# Patient Record
Sex: Male | Born: 1950 | Race: White | Hispanic: No | Marital: Married | Smoking: Never smoker
Health system: Southern US, Community
[De-identification: ages and names within clinical notes are randomized; demographics above are authoritative.]

## PROBLEM LIST (undated history)

## (undated) DIAGNOSIS — Z9889 Other specified postprocedural states: Secondary | ICD-10-CM

## (undated) DIAGNOSIS — I639 Cerebral infarction, unspecified: Secondary | ICD-10-CM

## (undated) DIAGNOSIS — M25569 Pain in unspecified knee: Secondary | ICD-10-CM

## (undated) DIAGNOSIS — B351 Tinea unguium: Secondary | ICD-10-CM

## (undated) DIAGNOSIS — I2510 Atherosclerotic heart disease of native coronary artery without angina pectoris: Secondary | ICD-10-CM

## (undated) DIAGNOSIS — M1990 Unspecified osteoarthritis, unspecified site: Secondary | ICD-10-CM

## (undated) DIAGNOSIS — G8929 Other chronic pain: Secondary | ICD-10-CM

## (undated) DIAGNOSIS — Z8719 Personal history of other diseases of the digestive system: Secondary | ICD-10-CM

## (undated) DIAGNOSIS — I10 Essential (primary) hypertension: Secondary | ICD-10-CM

## (undated) DIAGNOSIS — I240 Acute coronary thrombosis not resulting in myocardial infarction: Secondary | ICD-10-CM

## (undated) DIAGNOSIS — I4510 Unspecified right bundle-branch block: Secondary | ICD-10-CM

## (undated) DIAGNOSIS — G459 Transient cerebral ischemic attack, unspecified: Secondary | ICD-10-CM

## (undated) DIAGNOSIS — N529 Male erectile dysfunction, unspecified: Secondary | ICD-10-CM

## (undated) DIAGNOSIS — R112 Nausea with vomiting, unspecified: Secondary | ICD-10-CM

## (undated) DIAGNOSIS — I4891 Unspecified atrial fibrillation: Secondary | ICD-10-CM

## (undated) DIAGNOSIS — Z87442 Personal history of urinary calculi: Secondary | ICD-10-CM

## (undated) DIAGNOSIS — E785 Hyperlipidemia, unspecified: Secondary | ICD-10-CM

## (undated) HISTORY — DX: Tinea unguium: B35.1

## (undated) HISTORY — DX: Transient cerebral ischemic attack, unspecified: G45.9

## (undated) HISTORY — DX: Other chronic pain: G89.29

## (undated) HISTORY — DX: Unspecified atrial fibrillation: I48.91

## (undated) HISTORY — DX: Personal history of other diseases of the digestive system: Z87.19

## (undated) HISTORY — PX: INNER EAR SURGERY: SHX679

## (undated) HISTORY — DX: Morbid (severe) obesity due to excess calories: E66.01

## (undated) HISTORY — DX: Atherosclerotic heart disease of native coronary artery without angina pectoris: I25.10

## (undated) HISTORY — DX: Unspecified right bundle-branch block: I45.10

## (undated) HISTORY — DX: Other specified postprocedural states: Z98.890

## (undated) HISTORY — DX: Essential (primary) hypertension: I10

## (undated) HISTORY — DX: Male erectile dysfunction, unspecified: N52.9

## (undated) HISTORY — DX: Acute coronary thrombosis not resulting in myocardial infarction: I24.0

## (undated) HISTORY — DX: Pain in unspecified knee: M25.569

## (undated) HISTORY — PX: HERNIA REPAIR: SHX51

## (undated) HISTORY — DX: Hyperlipidemia, unspecified: E78.5

## (undated) HISTORY — DX: Unspecified osteoarthritis, unspecified site: M19.90

## (undated) HISTORY — PX: JOINT REPLACEMENT: SHX530

---

## 1996-03-18 DIAGNOSIS — I639 Cerebral infarction, unspecified: Secondary | ICD-10-CM

## 1996-03-18 HISTORY — DX: Cerebral infarction, unspecified: I63.9

## 1999-05-07 ENCOUNTER — Inpatient Hospital Stay (HOSPITAL_COMMUNITY): Admission: EM | Admit: 1999-05-07 | Discharge: 1999-05-09 | Payer: Self-pay | Admitting: Emergency Medicine

## 2013-01-16 DEATH — deceased

## 2016-07-01 ENCOUNTER — Telehealth: Payer: Self-pay

## 2016-07-01 NOTE — Telephone Encounter (Signed)
SENT NOTES TO SCHEDULING 

## 2016-07-03 NOTE — Telephone Encounter (Signed)
ERROR

## 2016-07-08 ENCOUNTER — Encounter: Payer: Self-pay | Admitting: Interventional Cardiology

## 2016-07-08 NOTE — Progress Notes (Signed)
Cardiology Office Note   Date:  07/09/2016   ID:  PAULETTE Melendez, DOB [birth year], MRN [id number]  PCP:  Feliciana Rossetti, MD    No chief complaint on file. preoperative clearance   Wt Readings from Last 3 Encounters:  07/09/16 274 lb (124.3 kg)       History of Present Illness: Oscar Melendez is a 66 y.o. male who is being seen today for the evaluation of preoperative clearance at the request of Dr. Madelon Lips.  He had a cardiac cath in 2009.  He had a fast heart rate and was diagnosed with AFib.  He felt a tightness which led to a cath showing an RCA CTO.  He has been managed medically.  He has not had cardiac issues since that time.  He is trying to lose weight.    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  He walks up 2 flights of stairs without any chest discomfort.  Overall, he feels well except being limited by his knee pain. Once his knee operation is done, he plans to increase his activity level.      Past Medical History:  Diagnosis Date  . Atherosclerotic heart disease of native coronary artery without angina pectoris   . Atrial fibrillation (HCC)   . Chronic knee pain   . Dermatophytosis of nail   . H/O inguinal hernia repair   . Hyperlipemia   . Hypertension   . Impotence of organic origin   . Incomplete RBBB   . Morbid obesity (HCC)   . Onychomycosis   . Osteoarthrosis   . RCA occlusion (HCC)   . Transient cerebral ischemia     Past Surgical History:  Procedure Laterality Date  . HERNIA REPAIR       Current Outpatient Prescriptions  Medication Sig Dispense Refill  . aspirin EC 81 MG tablet Take 81 mg by mouth daily.    Marland Kitchen b complex vitamins capsule Take 1 capsule by mouth daily.    . Calcium Citrate (CAL-CITRATE PO) Take 1 tablet by mouth daily.    Marland Kitchen lisinopril-hydrochlorothiazide (PRINZIDE,ZESTORETIC) 20-25 MG tablet Take 1 tablet by mouth daily.    . Magnesium 100 MG TABS Take 1 tablet by mouth daily.    . metoprolol (LOPRESSOR) 50 MG tablet Take  50 mg by mouth at bedtime.    . traMADol (ULTRAM) 50 MG tablet Take by mouth every 6 (six) hours as needed for moderate pain.    . Vitamin D, Cholecalciferol, 1000 units CAPS Take 1 capsule by mouth daily.     No current facility-administered medications for this visit.     Allergies:   Dye fdc red [red dye]    Social History:  The patient  reports that he has never smoked. He has never used smokeless tobacco. He reports that he does not drink alcohol or use drugs.   Family History:  The patient's family history includes Cancer in his brother; Heart disease in his mother.    ROS:  Please see the history of present illness.   Otherwise, review of systems are positive for Knee pain.   All other systems are reviewed and negative.    PHYSICAL EXAM: VS:  BP (!) 136/92   Pulse 80   Ht 6' (1.829 m)   Wt 274 lb (124.3 kg)   BMI 37.16 kg/m  , BMI Body mass index is 37.16 kg/m. GEN: Well nourished, well developed, in no acute distress  HEENT: normal  Neck: no JVD,  carotid bruits, or masses Cardiac: RRR; no murmurs, rubs, or gallops,no edema  Respiratory:  clear to auscultation bilaterally, normal work of breathing GI: soft, nontender, nondistended, + BS MS: no deformity or atrophy  Skin: warm and dry, no rash Neuro:  Strength and sensation are intact Psych: euthymic mood, full affect   EKG:   The ekg ordered today demonstrates Normal ECG   Recent Labs: No results found for requested labs within last 8760 hours.   Lipid Panel No results found for: CHOL, TRIG, HDL, CHOLHDL, VLDL, LDLCALC, LDLDIRECT   Other studies Reviewed: Additional studies/ records that were reviewed today with results demonstrating: Notes from primary care physician.   ASSESSMENT AND PLAN:  1. Coronary artery disease: He [facility] although I'm unable to find records of the report in Epic. From the other notes, it appears he did have a chronically occluded RCA. He had  collateral filling. He has not had any recent angina. He denies any heart failure symptoms. 2. Preoperative cardiovascular examination: From a cardiovascular standpoint, he is at moderate risk, 3-5%, of cardiac event in the perioperative period.  His risk comes from nonmodifiable risk factors such as age and prior CAD. No further cardiac testing needed at this time given his lack of symptoms. He understands the risk and is willing to proceed with surgery. 3. Long-term, I think he would benefit from lipid-lowering therapy. I explained the anti-inflammatory effects of statins. This may be beneficial to him even prior to knee surgery. He wants to check with his orthopedist before starting any medicine prior to surgery. Could start atorvastatin 10 mg daily. 4. HTN: COntinue BP meds.   Current medicines are reviewed at length with the patient today.  The patient concerns regarding his medicines were addressed.  The following changes have been made:  No change  Labs/ tests ordered today include:   Orders Placed This Encounter  Procedures  . EKG 12-Lead    Recommend 150 minutes/week of aerobic exercise Low fat, low carb, high fiber diet recommended  Disposition:   FU in After knee surgery   Signed, Lance Muss, MD  07/09/2016 9:11 AM    [facility] [address] [place], [place], [place]  [address] Phone: [phone]; Fax: [phone]

## 2016-07-09 ENCOUNTER — Other Ambulatory Visit: Payer: Self-pay

## 2016-07-09 ENCOUNTER — Ambulatory Visit (INDEPENDENT_AMBULATORY_CARE_PROVIDER_SITE_OTHER): Payer: Medicare HMO | Admitting: Interventional Cardiology

## 2016-07-09 ENCOUNTER — Encounter (INDEPENDENT_AMBULATORY_CARE_PROVIDER_SITE_OTHER): Payer: Self-pay

## 2016-07-09 ENCOUNTER — Encounter: Payer: Self-pay | Admitting: Interventional Cardiology

## 2016-07-09 VITALS — BP 136/92 | HR 80 | Ht 72.0 in | Wt 274.0 lb

## 2016-07-09 DIAGNOSIS — Z01810 Encounter for preprocedural cardiovascular examination: Secondary | ICD-10-CM | POA: Diagnosis not present

## 2016-07-09 DIAGNOSIS — I25119 Atherosclerotic heart disease of native coronary artery with unspecified angina pectoris: Secondary | ICD-10-CM

## 2016-07-09 DIAGNOSIS — I2510 Atherosclerotic heart disease of native coronary artery without angina pectoris: Secondary | ICD-10-CM | POA: Insufficient documentation

## 2016-07-09 DIAGNOSIS — I10 Essential (primary) hypertension: Secondary | ICD-10-CM

## 2016-07-09 HISTORY — DX: Atherosclerotic heart disease of native coronary artery without angina pectoris: I25.10

## 2016-07-09 NOTE — Patient Instructions (Signed)
Medication Instructions:  Your physician recommends that you continue on your current medications as directed. Please refer to the Current Medication list given to you today.   Labwork: None ordered.  Testing/Procedures: None ordered.  Follow-Up: Follow up after surgery.  Any Other Special Instructions Will Be Listed Below (If Applicable).     If you need a refill on your cardiac medications before your next appointment, please call your pharmacy.

## 2016-07-10 ENCOUNTER — Telehealth: Payer: Self-pay

## 2016-07-10 NOTE — Telephone Encounter (Signed)
Request for surgical clearance:  1. What type of surgery is being performed? Right Total Knee Replacement   2. When is this surgery scheduled? TBD   3. Are there any medications that need to be held prior to surgery and how long?   4. Name of physician performing surgery? Dr. Madelon Lips   5. What is your office phone and fax number? P- [phone], F- [phone]

## 2016-07-10 NOTE — Telephone Encounter (Signed)
Clearance, OV note, and EKG (no other studies on file) sent to [phone] Attn: Tresa Endo. Confirmation received that fax went through.

## 2016-08-13 ENCOUNTER — Ambulatory Visit: Payer: Self-pay | Admitting: Physician Assistant

## 2016-08-13 NOTE — H&P (Signed)
TOTAL KNEE ADMISSION H&P  Patient is being admitted for right total knee arthroplasty.  Subjective:  Chief Complaint:right knee pain.  HPI: Oscar Melendez, 66 y.o. male, has a history of pain and functional disability in the right knee due to arthritis and has failed non-surgical conservative treatments for greater than 12 weeks to includeNSAID's and/or analgesics, corticosteriod injections, viscosupplementation injections and activity modification.  Onset of symptoms was gradual, starting 10 years ago with progressively worsening course since that time. The patient noted no past surgery on the right knee(s).  Patient currently rates pain in the right knee(s) at 10 out of 10 with activity. Patient has night pain, worsening of pain with activity and weight bearing, pain that interferes with activities of daily living, pain with passive range of motion, crepitus and joint swelling.  Patient has evidence of periarticular osteophytes and joint space narrowing by imaging studies.  There is no active infection.  Patient Active Problem List   Diagnosis Date Noted  . CAD (coronary artery disease) 07/09/2016   Past Medical History:  Diagnosis Date  . Atherosclerotic heart disease of native coronary artery without angina pectoris   . Atrial fibrillation (HCC)   . Chronic knee pain   . Dermatophytosis of nail   . H/O inguinal hernia repair   . Hyperlipemia   . Hypertension   . Impotence of organic origin   . Incomplete RBBB   . Morbid obesity (HCC)   . Onychomycosis   . Osteoarthrosis   . RCA occlusion (HCC)   . Transient cerebral ischemia     Past Surgical History:  Procedure Laterality Date  . HERNIA REPAIR       (Not in a hospital admission) Allergies  Allergen Reactions  . Dye Fdc Red [Red Dye]     BURNING SENSATION    Social History  Substance Use Topics  . Smoking status: Never Smoker  . Smokeless tobacco: Never Used  . Alcohol use No    Family History  Problem Relation  Age of Onset  . Heart disease Mother   . Cancer Brother      Review of Systems  HENT: Positive for hearing loss.   Gastrointestinal: Positive for blood in stool and melena.  Genitourinary: Positive for hematuria.  Musculoskeletal: Positive for joint pain.  All other systems reviewed and are negative.   Objective:  Physical Exam  Constitutional: He is oriented to person, place, and time. He appears well-developed and well-nourished. No distress.  HENT:  Head: Normocephalic and atraumatic.  Nose: Nose normal.  Eyes: Conjunctivae and EOM are normal. Pupils are equal, round, and reactive to light.  Neck: Normal range of motion. Neck supple.  Cardiovascular: Normal rate, regular rhythm, normal heart sounds and intact distal pulses.   Respiratory: Effort normal and breath sounds normal. No respiratory distress. He has no wheezes.  GI: Soft. Bowel sounds are normal. He exhibits no distension. There is no tenderness.  Musculoskeletal:       Right knee: He exhibits swelling. He exhibits normal range of motion. Tenderness found.  Lymphadenopathy:    He has no cervical adenopathy.  Neurological: He is alert and oriented to person, place, and time.  Skin: Skin is warm and dry. No rash noted. No erythema.  Psychiatric: He has a normal mood and affect. His behavior is normal.    Vital signs in last 24 hours: @VSRANGES @  Labs:   Estimated body mass index is 37.16 kg/m as calculated from the following:   Height as  of 07/09/16: 6' (1.829 m).   Weight as of 07/09/16: 124.3 kg (274 lb).   Imaging Review Plain radiographs demonstrate moderate degenerative joint disease of the right knee(s). The overall alignment issignificant varus. The bone quality appears to be good for age and reported activity level.  Assessment/Plan:  End stage arthritis, right knee   The patient history, physical examination, clinical judgment of the provider and imaging studies are consistent with end stage  degenerative joint disease of the right knee(s) and total knee arthroplasty is deemed medically necessary. The treatment options including medical management, injection therapy arthroscopy and arthroplasty were discussed at length. The risks and benefits of total knee arthroplasty were presented and reviewed. The risks due to aseptic loosening, infection, stiffness, patella tracking problems, thromboembolic complications and other imponderables were discussed. The patient acknowledged the explanation, agreed to proceed with the plan and consent was signed. Patient is being admitted for inpatient treatment for surgery, pain control, PT, OT, prophylactic antibiotics, VTE prophylaxis, progressive ambulation and ADL's and discharge planning. The patient is planning to be discharged home with home health services

## 2016-08-19 ENCOUNTER — Encounter (HOSPITAL_COMMUNITY): Payer: Self-pay

## 2016-08-19 ENCOUNTER — Ambulatory Visit (HOSPITAL_COMMUNITY)
Admission: RE | Admit: 2016-08-19 | Discharge: 2016-08-19 | Disposition: A | Discharge status: Home / Self Care | Payer: Medicare HMO | Source: Ambulatory Visit | Attending: Physician Assistant | Admitting: Physician Assistant

## 2016-08-19 ENCOUNTER — Encounter (HOSPITAL_COMMUNITY)
Admission: RE | Admit: 2016-08-19 | Discharge: 2016-08-19 | Disposition: A | Discharge status: Home / Self Care | Payer: Medicare HMO | Source: Ambulatory Visit | Attending: Orthopedic Surgery | Admitting: Orthopedic Surgery

## 2016-08-19 DIAGNOSIS — Z01818 Encounter for other preprocedural examination: Secondary | ICD-10-CM | POA: Insufficient documentation

## 2016-08-19 DIAGNOSIS — I700 Atherosclerosis of aorta: Secondary | ICD-10-CM | POA: Diagnosis not present

## 2016-08-19 DIAGNOSIS — M1711 Unilateral primary osteoarthritis, right knee: Secondary | ICD-10-CM

## 2016-08-19 DIAGNOSIS — Z01812 Encounter for preprocedural laboratory examination: Secondary | ICD-10-CM | POA: Diagnosis not present

## 2016-08-19 HISTORY — DX: Nausea with vomiting, unspecified: R11.2

## 2016-08-19 HISTORY — DX: Nausea with vomiting, unspecified: Z98.890

## 2016-08-19 HISTORY — DX: Personal history of urinary calculi: Z87.442

## 2016-08-19 HISTORY — DX: Cerebral infarction, unspecified: I63.9

## 2016-08-19 LAB — TYPE AND SCREEN
ABO/RH(D): A POS
Antibody Screen: NEGATIVE

## 2016-08-19 LAB — COMPREHENSIVE METABOLIC PANEL
ALK PHOS: 60 U/L (ref 38–126)
ALT: 38 U/L (ref 17–63)
ANION GAP: 9 (ref 5–15)
AST: 32 U/L (ref 15–41)
Albumin: 3.9 g/dL (ref 3.5–5.0)
BILIRUBIN TOTAL: 1.1 mg/dL (ref 0.3–1.2)
BUN: 13 mg/dL (ref 6–20)
CALCIUM: 9.3 mg/dL (ref 8.9–10.3)
CO2: 25 mmol/L (ref 22–32)
Chloride: 105 mmol/L (ref 101–111)
Creatinine, Ser: 1.18 mg/dL (ref 0.61–1.24)
GFR calc Af Amer: >60 mL/min (ref >60)
GFR calc non Af Amer: >60 mL/min (ref >60)
GLUCOSE: 110 mg/dL — AB (ref 65–99)
Potassium: 3.9 mmol/L (ref 3.5–5.1)
Sodium: 139 mmol/L (ref 135–145)
TOTAL PROTEIN: 7.2 g/dL (ref 6.5–8.1)

## 2016-08-19 LAB — ABO/RH: ABO/RH(D): A POS

## 2016-08-19 LAB — CBC WITH DIFFERENTIAL/PLATELET
BASOS PCT: 1 %
Basophils Absolute: 0 10*3/uL (ref 0.0–0.1)
Eosinophils Absolute: 0.2 10*3/uL (ref 0.0–0.7)
Eosinophils Relative: 3 %
HEMATOCRIT: 51.9 % (ref 39.0–52.0)
HEMOGLOBIN: 17.4 g/dL — AB (ref 13.0–17.0)
LYMPHS ABS: 1.5 10*3/uL (ref 0.7–4.0)
LYMPHS PCT: 25 %
MCH: 29.8 pg (ref 26.0–34.0)
MCHC: 33.5 g/dL (ref 30.0–36.0)
MCV: 89 fL (ref 78.0–100.0)
MONOS PCT: 9 %
Monocytes Absolute: 0.6 10*3/uL (ref 0.1–1.0)
NEUTROS ABS: 3.8 10*3/uL (ref 1.7–7.7)
NEUTROS PCT: 62 %
Platelets: 153 10*3/uL (ref 150–400)
RBC: 5.83 MIL/uL — ABNORMAL HIGH (ref 4.22–5.81)
RDW: 14 % (ref 11.5–15.5)
WBC: 6 10*3/uL (ref 4.0–10.5)

## 2016-08-19 LAB — URINALYSIS, ROUTINE W REFLEX MICROSCOPIC
Bilirubin Urine: NEGATIVE
Glucose, UA: NEGATIVE mg/dL
Hgb urine dipstick: NEGATIVE
Ketones, ur: NEGATIVE mg/dL
LEUKOCYTES UA: NEGATIVE
NITRITE: NEGATIVE
Protein, ur: NEGATIVE mg/dL
SPECIFIC GRAVITY, URINE: 1.02 (ref 1.005–1.030)
pH: 5 (ref 5.0–8.0)

## 2016-08-19 LAB — SURGICAL PCR SCREEN
MRSA, PCR: NEGATIVE
STAPHYLOCOCCUS AUREUS: NEGATIVE

## 2016-08-19 LAB — APTT: aPTT: 30 seconds (ref 24–36)

## 2016-08-19 LAB — PROTIME-INR
INR: 1.02
Prothrombin Time: 13.4 seconds (ref 11.4–15.2)

## 2016-08-19 NOTE — Pre-Procedure Instructions (Addendum)
Cecille AverJames D Hove  08/19/2016      [facility] [place], [place] - [address] [place] [id number] Phone: [phone] Fax: [phone]  [facility] [address] - [place], [place] - [id number] [place] [id number] Phone: [phone] Fax: [phone]    Your procedure is scheduled on Fri. June 15  [facility] Admitting at 5:30 A.M.  Call this number if you have problems the morning of surgery:  [phone]   Remember:  Do not eat food or drink liquids after midnight on Thurs. June 14   Take these medicines the morning of surgery with A SIP OF WATER : tylenol if needed, metoprolol,, tramadol if needed              1 week prior to surgery stop: advil, motrin, ibuprofen, aleve, Bc Powders, Goody's, vitamins/herbal medicines.              Stop aspirin per Dr. Madelon Lipsaffrey   Do not wear jewelry.  Do not wear lotions, powders, or perfumes, or deoderant.  Do not shave 48 hours prior to surgery.  Men may shave face and neck.  Do not bring valuables to the hospital.  [facility] is not responsible for any belongings or valuables.  Contacts, dentures or bridgework may not be worn into surgery.  Leave your suitcase in the car.  After surgery it may be brought to your room.  For patients admitted to the hospital, discharge time will be determined by your treatment team.  Patients discharged the day of surgery will not be allowed to drive home.    Special instructions:  [place]- Preparing For Surgery  Before surgery, you can play an important role. Because skin is not sterile, your skin needs to be as free of germs as possible. You can reduce the number of germs on your skin by washing with CHG (chlorahexidine gluconate) Soap before surgery.  CHG is an antiseptic cleaner which kills germs and bonds with the skin to continue killing germs even after washing.  Please do not use if you have an  allergy to CHG or antibacterial soaps. If your skin becomes reddened/irritated stop using the CHG.  Do not shave (including legs and underarms) for at least 48 hours prior to first CHG shower. It is OK to shave your face.  Please follow these instructions carefully.   1. Shower the NIGHT BEFORE SURGERY and the MORNING OF SURGERY with CHG.   2. If you chose to wash your hair, wash your hair first as usual with your normal shampoo.  3. After you shampoo, rinse your hair and body thoroughly to remove the shampoo.  4. Use CHG as you would any other liquid soap. You can apply CHG directly to the skin and wash gently with a scrungie or a clean washcloth.   5. Apply the CHG Soap to your body ONLY FROM THE NECK DOWN.  Do not use on open wounds or open sores. Avoid contact with your eyes, ears, mouth and genitals (private parts). Wash genitals (private parts) with your normal soap.  6. Wash thoroughly, paying special attention to the area where your surgery will be performed.  7. Thoroughly rinse your body with warm water from the neck down.  8. DO NOT shower/wash with your normal soap after using and rinsing off the CHG Soap.  9. Pat yourself dry with a CLEAN TOWEL.  10. Wear CLEAN PAJAMAS   11. Place CLEAN SHEETS on your bed the night of your first shower and DO NOT SLEEP WITH PETS.    Day of Surgery: Do not apply any deodorants/lotions. Please wear clean clothes to the hospital/surgery center.      Please read over the following fact sheets that you were given. Coughing and Deep Breathing, MRSA Information and Surgical Site Infection Prevention

## 2016-08-19 NOTE — Progress Notes (Addendum)
PCP: Dr. Feliciana RossettiGreg Grisso @ [facility] [place] Cardiologist: Dr. Eldridge DaceVaranasi Pt. Stated he is to stop aspirin 1 week prior tos surgery.  Pt. Arrived with blood pressure 148/104. States he didn't take blood pressure meds. This am. Thought he was to fast and not take medications. Pt. States he checks his blood pressure at home and dialostic number is usually 80-81. Encourage pt to continue to take the pressure at home and it dialostic is higher than 100 he should notify Dr. Karenann CaiVarasani. Helyn NumbersAllison Zeleneck, P.A. Notified.

## 2016-08-20 LAB — URINE CULTURE: CULTURE: NO GROWTH

## 2016-08-20 NOTE — Progress Notes (Signed)
Anesthesia Chart Review:  Pt is a 66 year old male scheduled for R total knee arthroplasty on 08/30/2016 with Frederico Hammananiel Caffrey, M.D.  - PCP is Feliciana RossettiGreg Grisso, MD - Cardiologist is Lance MussJayadeep Varanasi, MD who has cleared pt for surgery at moderate risk.   PMH includes: CAD (reportedly CTO of RCA by 2009 cath), atrial fibrillation, HTN hyperlipidemia, stroke, TIA, post-op N/V. Never smoker. BMI 36.  Medications include: ASA 81 mg, lisinopril-HCTZ, metoprolol, potassium  BP (!) 131/102 Comment: rechecked/notified Cathy S. RN  Pulse 78   Temp 36.5 C   Resp 20   Ht 6' (1.829 m)   Wt 266 lb 12.8 oz (121 kg)   SpO2 95%   BMI 36.18 kg/m    BP was initially 148/104, recheck was 131/102. Pt had not taken BP meds the day of PAT. Pt instructed to take meds as prescribed and to check BP at home; if diastolic BP higher than 100, pt to notify Dr. Eldridge DaceVaranasi.  - BP at recent health care appointments 136/92 and 130/90.   Preoperative labs reviewed.  CXR 08/19/16: No active cardiopulmonary disease. Aortic atherosclerosis.   EKG 07/09/16: NSR  If BP acceptable DOS, I anticipate pt can proceed as scheduled.   Rica Mastngela Nazario Russom, FNP-BC [facility] Phone: [phone] 08/20/2016 3:13 PM

## 2016-08-29 MED ORDER — DEXTROSE 5 % IV SOLN
3.0000 g | INTRAVENOUS | Status: AC
  Administered 2016-08-30: 3 g via INTRAVENOUS
  Filled 2016-08-29: qty 3000

## 2016-08-29 MED ORDER — SODIUM CHLORIDE 0.9 % IV SOLN: INTRAVENOUS | Status: DC

## 2016-08-29 MED ORDER — BUPIVACAINE LIPOSOME 1.3 % IJ SUSP
20.0000 mL | INTRAMUSCULAR | Status: AC
  Administered 2016-08-30: 20 mL
  Filled 2016-08-29: qty 20

## 2016-08-29 MED ORDER — TRANEXAMIC ACID 1000 MG/10ML IV SOLN
1000.0000 mg | INTRAVENOUS | Status: DC
  Filled 2016-08-29: qty 10

## 2016-08-29 NOTE — Anesthesia Preprocedure Evaluation (Addendum)
Anesthesia Evaluation  Patient identified by MRN, date of birth, ID band Patient awake    Reviewed: Allergy & Precautions, H&P , NPO status , Patient's Chart, lab work & pertinent test results, reviewed documented beta blocker date and time   History of Anesthesia Complications (+) PONV  Airway Mallampati: II  TM Distance: >3 FB Neck ROM: Full    Dental no notable dental hx. (+) Teeth Intact, Dental Advisory Given   Pulmonary neg pulmonary ROS,    Pulmonary exam normal breath sounds clear to auscultation       Cardiovascular Exercise Tolerance: Good hypertension, Pt. on medications and Pt. on home beta blockers + CAD   Rhythm:Regular Rate:Normal     Neuro/Psych CVA, No Residual Symptoms negative psych ROS   GI/Hepatic negative GI ROS, Neg liver ROS,   Endo/Other  Morbid obesity  Renal/GU negative Renal ROS  negative genitourinary   Musculoskeletal  (+) Arthritis , Osteoarthritis,    Abdominal   Peds  Hematology negative hematology ROS (+)   Anesthesia Other Findings   Reproductive/Obstetrics negative OB ROS                            Anesthesia Physical Anesthesia Plan  ASA: III  Anesthesia Plan: Spinal   Post-op Pain Management:  Regional for Post-op pain   Induction: Intravenous  PONV Risk Score and Plan: 3 and Ondansetron, Dexamethasone, Propofol and Midazolam  Airway Management Planned: Simple Face Mask  Additional Equipment:   Intra-op Plan:   Post-operative Plan:   Informed Consent: I have reviewed the patients History and Physical, chart, labs and discussed the procedure including the risks, benefits and alternatives for the proposed anesthesia with the patient or authorized representative who has indicated his/her understanding and acceptance.   Dental advisory given  Plan Discussed with: CRNA  Anesthesia Plan Comments:        Anesthesia Quick  Evaluation

## 2016-08-30 ENCOUNTER — Inpatient Hospital Stay (HOSPITAL_COMMUNITY): Payer: Medicare HMO | Admitting: Anesthesiology

## 2016-08-30 ENCOUNTER — Inpatient Hospital Stay (HOSPITAL_COMMUNITY): Payer: Medicare HMO | Admitting: Vascular Surgery

## 2016-08-30 ENCOUNTER — Inpatient Hospital Stay (HOSPITAL_COMMUNITY)
Admission: RE | Admit: 2016-08-30 | Discharge: 2016-09-01 | DRG: 470 | Disposition: A | Discharge status: Home Health | Payer: Medicare HMO | Source: Ambulatory Visit | Attending: Orthopedic Surgery | Admitting: Orthopedic Surgery

## 2016-08-30 ENCOUNTER — Encounter (HOSPITAL_COMMUNITY): Payer: Self-pay | Admitting: Urology

## 2016-08-30 ENCOUNTER — Encounter (HOSPITAL_COMMUNITY)
Admission: RE | Disposition: A | Discharge status: Home Health | Payer: Self-pay | Source: Ambulatory Visit | Attending: Orthopedic Surgery

## 2016-08-30 DIAGNOSIS — M25561 Pain in right knee: Secondary | ICD-10-CM | POA: Diagnosis present

## 2016-08-30 DIAGNOSIS — I4891 Unspecified atrial fibrillation: Secondary | ICD-10-CM | POA: Diagnosis present

## 2016-08-30 DIAGNOSIS — I2510 Atherosclerotic heart disease of native coronary artery without angina pectoris: Secondary | ICD-10-CM | POA: Diagnosis present

## 2016-08-30 DIAGNOSIS — D62 Acute posthemorrhagic anemia: Secondary | ICD-10-CM | POA: Diagnosis not present

## 2016-08-30 DIAGNOSIS — Z6837 Body mass index (BMI) 37.0-37.9, adult: Secondary | ICD-10-CM

## 2016-08-30 DIAGNOSIS — E785 Hyperlipidemia, unspecified: Secondary | ICD-10-CM | POA: Diagnosis present

## 2016-08-30 DIAGNOSIS — Z8673 Personal history of transient ischemic attack (TIA), and cerebral infarction without residual deficits: Secondary | ICD-10-CM

## 2016-08-30 DIAGNOSIS — I10 Essential (primary) hypertension: Secondary | ICD-10-CM | POA: Diagnosis present

## 2016-08-30 DIAGNOSIS — M1711 Unilateral primary osteoarthritis, right knee: Secondary | ICD-10-CM | POA: Diagnosis present

## 2016-08-30 DIAGNOSIS — Z8249 Family history of ischemic heart disease and other diseases of the circulatory system: Secondary | ICD-10-CM

## 2016-08-30 HISTORY — PX: TOTAL KNEE ARTHROPLASTY: SHX125

## 2016-08-30 HISTORY — DX: Atherosclerotic heart disease of native coronary artery without angina pectoris: I25.10

## 2016-08-30 SURGERY — ARTHROPLASTY, KNEE, TOTAL
Anesthesia: Spinal | Site: Knee | Laterality: Right

## 2016-08-30 MED ORDER — TRAMADOL HCL 50 MG PO TABS
100.0000 mg | ORAL_TABLET | Freq: Four times a day (QID) | ORAL | Status: DC | PRN
  Administered 2016-08-30 – 2016-08-31 (×2): 100 mg via ORAL
  Filled 2016-08-30 (×2): qty 2

## 2016-08-30 MED ORDER — APIXABAN 2.5 MG PO TABS: 2.5000 mg | ORAL_TABLET | Freq: Two times a day (BID) | ORAL | 0 refills | Status: DC

## 2016-08-30 MED ORDER — CEFAZOLIN SODIUM-DEXTROSE 1-4 GM/50ML-% IV SOLN
1.0000 g | Freq: Four times a day (QID) | INTRAVENOUS | Status: AC
  Administered 2016-08-30 (×2): 1 g via INTRAVENOUS
  Filled 2016-08-30 (×2): qty 50

## 2016-08-30 MED ORDER — ASPIRIN EC 81 MG PO TBEC
81.0000 mg | DELAYED_RELEASE_TABLET | Freq: Every day | ORAL | Status: DC
  Administered 2016-08-30 – 2016-09-01 (×3): 81 mg via ORAL
  Filled 2016-08-30 (×2): qty 1

## 2016-08-30 MED ORDER — GABAPENTIN 300 MG PO CAPS
ORAL_CAPSULE | ORAL | Status: AC
  Administered 2016-08-30: 600 mg via ORAL
  Filled 2016-08-30: qty 2

## 2016-08-30 MED ORDER — HYDROCODONE-ACETAMINOPHEN 5-325 MG PO TABS: 1.0000 | ORAL_TABLET | ORAL | 0 refills | Status: AC | PRN

## 2016-08-30 MED ORDER — LIDOCAINE 2% (20 MG/ML) 5 ML SYRINGE
INTRAMUSCULAR | Status: AC
  Filled 2016-08-30: qty 5

## 2016-08-30 MED ORDER — SODIUM CHLORIDE 0.9 % IR SOLN
Status: DC | PRN
  Administered 2016-08-30: 1000 mL

## 2016-08-30 MED ORDER — ROPIVACAINE HCL 5 MG/ML IJ SOLN
INTRAMUSCULAR | Status: DC | PRN
  Administered 2016-08-30: 25 mL via PERINEURAL

## 2016-08-30 MED ORDER — MIDAZOLAM HCL 2 MG/2ML IJ SOLN
INTRAMUSCULAR | Status: AC
  Filled 2016-08-30: qty 2

## 2016-08-30 MED ORDER — SODIUM CHLORIDE 0.9 % IV SOLN
INTRAVENOUS | Status: DC
  Administered 2016-08-30 – 2016-08-31 (×2): via INTRAVENOUS

## 2016-08-30 MED ORDER — MIDAZOLAM HCL 2 MG/2ML IJ SOLN
INTRAMUSCULAR | Status: DC | PRN
  Administered 2016-08-30: 2 mg via INTRAVENOUS

## 2016-08-30 MED ORDER — BUPIVACAINE-EPINEPHRINE (PF) 0.25% -1:200000 IJ SOLN
INTRAMUSCULAR | Status: AC
  Filled 2016-08-30: qty 30

## 2016-08-30 MED ORDER — ONDANSETRON HCL 4 MG/2ML IJ SOLN
INTRAMUSCULAR | Status: AC
  Filled 2016-08-30: qty 2

## 2016-08-30 MED ORDER — LIDOCAINE 2% (20 MG/ML) 5 ML SYRINGE
INTRAMUSCULAR | Status: DC | PRN
  Administered 2016-08-30: 100 mg via INTRAVENOUS

## 2016-08-30 MED ORDER — BUPIVACAINE-EPINEPHRINE (PF) 0.25% -1:200000 IJ SOLN
INTRAMUSCULAR | Status: DC | PRN
  Administered 2016-08-30: 50 mL

## 2016-08-30 MED ORDER — METOCLOPRAMIDE HCL 5 MG PO TABS: 5.0000 mg | ORAL_TABLET | Freq: Three times a day (TID) | ORAL | Status: DC | PRN

## 2016-08-30 MED ORDER — CHLORHEXIDINE GLUCONATE 4 % EX LIQD: 60.0000 mL | Freq: Once | CUTANEOUS | Status: DC

## 2016-08-30 MED ORDER — FLEET ENEMA 7-19 GM/118ML RE ENEM: 1.0000 | ENEMA | Freq: Once | RECTAL | Status: DC | PRN

## 2016-08-30 MED ORDER — DOCUSATE SODIUM 100 MG PO CAPS
100.0000 mg | ORAL_CAPSULE | Freq: Two times a day (BID) | ORAL | Status: DC
  Administered 2016-08-30 – 2016-09-01 (×4): 100 mg via ORAL
  Filled 2016-08-30 (×4): qty 1

## 2016-08-30 MED ORDER — VITAMIN D 1000 UNITS PO TABS
1000.0000 [IU] | ORAL_TABLET | Freq: Every day | ORAL | Status: DC
  Administered 2016-08-30 – 2016-09-01 (×3): 1000 [IU] via ORAL
  Filled 2016-08-30 (×3): qty 1

## 2016-08-30 MED ORDER — ACETAMINOPHEN 325 MG PO TABS: 650.0000 mg | ORAL_TABLET | Freq: Four times a day (QID) | ORAL | Status: DC | PRN

## 2016-08-30 MED ORDER — HYDROMORPHONE HCL 1 MG/ML IJ SOLN
1.0000 mg | INTRAMUSCULAR | Status: DC | PRN
  Administered 2016-08-30 – 2016-09-01 (×6): 1 mg via INTRAVENOUS
  Filled 2016-08-30 (×6): qty 1

## 2016-08-30 MED ORDER — APIXABAN 2.5 MG PO TABS
2.5000 mg | ORAL_TABLET | Freq: Two times a day (BID) | ORAL | Status: DC
  Administered 2016-08-31 – 2016-09-01 (×3): 2.5 mg via ORAL
  Filled 2016-08-30 (×3): qty 1

## 2016-08-30 MED ORDER — TRAMADOL HCL 50 MG PO TABS: 100.0000 mg | ORAL_TABLET | Freq: Four times a day (QID) | ORAL | 0 refills | Status: DC | PRN

## 2016-08-30 MED ORDER — PHENYLEPHRINE 40 MCG/ML (10ML) SYRINGE FOR IV PUSH (FOR BLOOD PRESSURE SUPPORT)
PREFILLED_SYRINGE | INTRAVENOUS | Status: AC
  Filled 2016-08-30: qty 10

## 2016-08-30 MED ORDER — HYDROMORPHONE HCL 1 MG/ML IJ SOLN: 0.2500 mg | INTRAMUSCULAR | Status: DC | PRN

## 2016-08-30 MED ORDER — LISINOPRIL 20 MG PO TABS
20.0000 mg | ORAL_TABLET | Freq: Every day | ORAL | Status: DC
Start: 2016-08-30 — End: 2016-09-01
  Administered 2016-08-30 – 2016-09-01 (×3): 20 mg via ORAL
  Filled 2016-08-30 (×3): qty 1

## 2016-08-30 MED ORDER — GABAPENTIN 300 MG PO CAPS
600.0000 mg | ORAL_CAPSULE | Freq: Once | ORAL | Status: AC
  Administered 2016-08-30: 600 mg via ORAL
  Filled 2016-08-30: qty 2

## 2016-08-30 MED ORDER — MAGNESIUM OXIDE 400 (241.3 MG) MG PO TABS
200.0000 mg | ORAL_TABLET | Freq: Every day | ORAL | Status: DC
  Administered 2016-08-31 – 2016-09-01 (×2): 200 mg via ORAL
  Filled 2016-08-30 (×2): qty 1

## 2016-08-30 MED ORDER — PHENOL 1.4 % MT LIQD: 1.0000 | OROMUCOSAL | Status: DC | PRN

## 2016-08-30 MED ORDER — BUPIVACAINE IN DEXTROSE 0.75-8.25 % IT SOLN
INTRATHECAL | Status: DC | PRN
  Administered 2016-08-30: 15 mg via INTRATHECAL

## 2016-08-30 MED ORDER — POTASSIUM CHLORIDE 20 MEQ/15ML (10%) PO SOLN
1.3000 meq | Freq: Every day | ORAL | Status: DC
  Administered 2016-08-31 – 2016-09-01 (×2): 1.3333 meq via ORAL
  Filled 2016-08-30 (×2): qty 15

## 2016-08-30 MED ORDER — PHENYLEPHRINE HCL 10 MG/ML IJ SOLN
INTRAMUSCULAR | Status: DC | PRN
  Administered 2016-08-30: 25 ug/min via INTRAVENOUS

## 2016-08-30 MED ORDER — METOPROLOL TARTRATE 25 MG PO TABS
25.0000 mg | ORAL_TABLET | Freq: Every day | ORAL | Status: DC
  Administered 2016-08-31 – 2016-09-01 (×2): 25 mg via ORAL
  Filled 2016-08-30 (×2): qty 1

## 2016-08-30 MED ORDER — ACETAMINOPHEN 500 MG PO TABS
ORAL_TABLET | ORAL | Status: AC
  Administered 2016-08-30: 1000 mg via ORAL
  Filled 2016-08-30: qty 2

## 2016-08-30 MED ORDER — HYDROCHLOROTHIAZIDE 25 MG PO TABS
25.0000 mg | ORAL_TABLET | Freq: Every day | ORAL | Status: DC
  Administered 2016-08-30 – 2016-09-01 (×3): 25 mg via ORAL
  Filled 2016-08-30 (×3): qty 1

## 2016-08-30 MED ORDER — POLYETHYLENE GLYCOL 3350 17 G PO PACK: 17.0000 g | PACK | Freq: Every day | ORAL | Status: DC | PRN

## 2016-08-30 MED ORDER — LACTATED RINGERS IV SOLN
INTRAVENOUS | Status: DC | PRN
  Administered 2016-08-30 (×2): via INTRAVENOUS

## 2016-08-30 MED ORDER — MENTHOL 3 MG MT LOZG: 1.0000 | LOZENGE | OROMUCOSAL | Status: DC | PRN

## 2016-08-30 MED ORDER — SORBITOL 70 % SOLN: 30.0000 mL | Freq: Every day | Status: DC | PRN

## 2016-08-30 MED ORDER — SODIUM CHLORIDE 0.9 % IV SOLN
2000.0000 mg | INTRAVENOUS | Status: AC
  Administered 2016-08-30: 2000 mg via TOPICAL
  Filled 2016-08-30: qty 20

## 2016-08-30 MED ORDER — SODIUM CHLORIDE 0.9% FLUSH
INTRAVENOUS | Status: DC | PRN
  Administered 2016-08-30: 50 mL via INTRAVENOUS

## 2016-08-30 MED ORDER — DIPHENHYDRAMINE HCL 12.5 MG/5ML PO ELIX: 12.5000 mg | ORAL_SOLUTION | ORAL | Status: DC | PRN

## 2016-08-30 MED ORDER — ONDANSETRON HCL 4 MG/2ML IJ SOLN
4.0000 mg | Freq: Four times a day (QID) | INTRAMUSCULAR | Status: DC | PRN
  Administered 2016-08-30: 4 mg via INTRAVENOUS
  Filled 2016-08-30: qty 2

## 2016-08-30 MED ORDER — ONDANSETRON HCL 4 MG PO TABS: 4.0000 mg | ORAL_TABLET | Freq: Four times a day (QID) | ORAL | Status: DC | PRN

## 2016-08-30 MED ORDER — METOCLOPRAMIDE HCL 5 MG/ML IJ SOLN
5.0000 mg | Freq: Three times a day (TID) | INTRAMUSCULAR | Status: DC | PRN
Start: 2016-08-30 — End: 2016-09-01

## 2016-08-30 MED ORDER — FENTANYL CITRATE (PF) 250 MCG/5ML IJ SOLN
INTRAMUSCULAR | Status: DC | PRN
  Administered 2016-08-30: 50 ug via INTRAVENOUS

## 2016-08-30 MED ORDER — ACETAMINOPHEN 500 MG PO TABS
1000.0000 mg | ORAL_TABLET | Freq: Once | ORAL | Status: AC
  Administered 2016-08-30: 1000 mg via ORAL
  Filled 2016-08-30: qty 2

## 2016-08-30 MED ORDER — PROPOFOL 10 MG/ML IV BOLUS
INTRAVENOUS | Status: AC
  Filled 2016-08-30: qty 20

## 2016-08-30 MED ORDER — FENTANYL CITRATE (PF) 250 MCG/5ML IJ SOLN
INTRAMUSCULAR | Status: AC
  Filled 2016-08-30: qty 5

## 2016-08-30 MED ORDER — PROPOFOL 500 MG/50ML IV EMUL
INTRAVENOUS | Status: DC | PRN
  Administered 2016-08-30: 50 ug/kg/min via INTRAVENOUS

## 2016-08-30 MED ORDER — HYDROCODONE-ACETAMINOPHEN 5-325 MG PO TABS
1.0000 | ORAL_TABLET | ORAL | Status: DC | PRN
  Administered 2016-09-01: 2 via ORAL
  Filled 2016-08-30: qty 2

## 2016-08-30 MED ORDER — LISINOPRIL-HYDROCHLOROTHIAZIDE 20-25 MG PO TABS: 1.0000 | ORAL_TABLET | Freq: Every day | ORAL | Status: DC

## 2016-08-30 SURGICAL SUPPLY — 66 items
BANDAGE ACE 4X5 VEL STRL LF (GAUZE/BANDAGES/DRESSINGS) ×3 IMPLANT
BANDAGE ACE 6X5 VEL STRL LF (GAUZE/BANDAGES/DRESSINGS) ×3 IMPLANT
BANDAGE ELASTIC 4 VELCRO ST LF (GAUZE/BANDAGES/DRESSINGS) ×3 IMPLANT
BANDAGE ELASTIC 6 VELCRO ST LF (GAUZE/BANDAGES/DRESSINGS) ×3 IMPLANT
BANDAGE ESMARK 6X9 LF (GAUZE/BANDAGES/DRESSINGS) ×1 IMPLANT
BLADE SAGITTAL 25.0X1.19X90 (BLADE) ×2 IMPLANT
BLADE SAGITTAL 25.0X1.19X90MM (BLADE) ×1
BLADE SAW SAG 90X13X1.27 (BLADE) ×3 IMPLANT
BNDG ESMARK 6X9 LF (GAUZE/BANDAGES/DRESSINGS) ×3
BNDG GAUZE ELAST 4 BULKY (GAUZE/BANDAGES/DRESSINGS) ×3 IMPLANT
BOWL SMART MIX CTS (DISPOSABLE) ×3 IMPLANT
CAP KNEE TOTAL 3 SIGMA ×3 IMPLANT
COVER SURGICAL LIGHT HANDLE (MISCELLANEOUS) ×3 IMPLANT
CUFF TOURNIQUET SINGLE 34IN LL (TOURNIQUET CUFF) ×3 IMPLANT
CUFF TOURNIQUET SINGLE 44IN (TOURNIQUET CUFF) IMPLANT
DRAPE INCISE IOBAN 66X45 STRL (DRAPES) IMPLANT
DRAPE ORTHO SPLIT 77X108 STRL (DRAPES) ×4
DRAPE SURG ORHT 6 SPLT 77X108 (DRAPES) ×2 IMPLANT
DRAPE U-SHAPE 47X51 STRL (DRAPES) ×3 IMPLANT
DRSG ADAPTIC 3X8 NADH LF (GAUZE/BANDAGES/DRESSINGS) ×3 IMPLANT
DRSG PAD ABDOMINAL 8X10 ST (GAUZE/BANDAGES/DRESSINGS) ×6 IMPLANT
DURAPREP 26ML APPLICATOR (WOUND CARE) ×3 IMPLANT
ELECT REM PT RETURN 9FT ADLT (ELECTROSURGICAL) ×3
ELECTRODE REM PT RTRN 9FT ADLT (ELECTROSURGICAL) ×1 IMPLANT
EVACUATOR 1/8 PVC DRAIN (DRAIN) IMPLANT
FACESHIELD WRAPAROUND (MASK) ×6 IMPLANT
FLOSEAL 10ML (HEMOSTASIS) IMPLANT
GAUZE SPONGE 4X4 12PLY STRL (GAUZE/BANDAGES/DRESSINGS) ×3 IMPLANT
GAUZE SPONGE 4X4 12PLY STRL LF (GAUZE/BANDAGES/DRESSINGS) ×3 IMPLANT
GLOVE BIOGEL PI IND STRL 8 (GLOVE) ×4 IMPLANT
GLOVE BIOGEL PI INDICATOR 8 (GLOVE) ×8
GLOVE ORTHO TXT STRL SZ7.5 (GLOVE) ×3 IMPLANT
GLOVE SURG ORTHO 8.0 STRL STRW (GLOVE) ×3 IMPLANT
GOWN STRL REUS W/ TWL LRG LVL3 (GOWN DISPOSABLE) ×2 IMPLANT
GOWN STRL REUS W/ TWL XL LVL3 (GOWN DISPOSABLE) ×1 IMPLANT
GOWN STRL REUS W/TWL 2XL LVL3 (GOWN DISPOSABLE) ×3 IMPLANT
GOWN STRL REUS W/TWL LRG LVL3 (GOWN DISPOSABLE) ×4
GOWN STRL REUS W/TWL XL LVL3 (GOWN DISPOSABLE) ×2
HANDPIECE INTERPULSE COAX TIP (DISPOSABLE) ×2
HOOD PEEL AWAY FACE SHEILD DIS (HOOD) ×3 IMPLANT
IMMOBILIZER KNEE 22 UNIV (SOFTGOODS) IMPLANT
KIT BASIN OR (CUSTOM PROCEDURE TRAY) ×3 IMPLANT
KIT ROOM TURNOVER OR (KITS) ×3 IMPLANT
MANIFOLD NEPTUNE II (INSTRUMENTS) ×3 IMPLANT
NEEDLE 22X1 1/2 (OR ONLY) (NEEDLE) ×6 IMPLANT
NS IRRIG 1000ML POUR BTL (IV SOLUTION) ×3 IMPLANT
PACK TOTAL JOINT (CUSTOM PROCEDURE TRAY) ×3 IMPLANT
PAD ARMBOARD 7.5X6 YLW CONV (MISCELLANEOUS) ×6 IMPLANT
PAD CAST 4YDX4 CTTN HI CHSV (CAST SUPPLIES) ×1 IMPLANT
PADDING CAST COTTON 4X4 STRL (CAST SUPPLIES) ×2
PADDING CAST COTTON 6X4 STRL (CAST SUPPLIES) ×3 IMPLANT
SET HNDPC FAN SPRY TIP SCT (DISPOSABLE) ×1 IMPLANT
STAPLER VISISTAT 35W (STAPLE) ×3 IMPLANT
SUCTION FRAZIER HANDLE 10FR (MISCELLANEOUS) ×2
SUCTION TUBE FRAZIER 10FR DISP (MISCELLANEOUS) ×1 IMPLANT
SUT ETHIBOND NAB CT1 #1 30IN (SUTURE) ×9 IMPLANT
SUT VIC AB 0 CT1 27 (SUTURE) ×2
SUT VIC AB 0 CT1 27XBRD ANBCTR (SUTURE) ×1 IMPLANT
SUT VIC AB 2-0 CT1 27 (SUTURE) ×4
SUT VIC AB 2-0 CT1 TAPERPNT 27 (SUTURE) ×2 IMPLANT
SYR CONTROL 10ML LL (SYRINGE) ×6 IMPLANT
TOWEL OR 17X24 6PK STRL BLUE (TOWEL DISPOSABLE) ×3 IMPLANT
TOWEL OR 17X26 10 PK STRL BLUE (TOWEL DISPOSABLE) ×3 IMPLANT
TRAY CATH 16FR W/PLASTIC CATH (SET/KITS/TRAYS/PACK) IMPLANT
TRAY FOLEY W/METER SILVER 16FR (SET/KITS/TRAYS/PACK) IMPLANT
WATER STERILE IRR 1000ML POUR (IV SOLUTION) ×3 IMPLANT

## 2016-08-30 NOTE — Interval H&P Note (Signed)
History and Physical Interval Note:  08/30/2016 7:25 AM  Oscar Melendez  has presented today for surgery, with the diagnosis of OA RIGHT KNEE  The various methods of treatment have been discussed with the patient and family. After consideration of risks, benefits and other options for treatment, the patient has consented to  Procedure(s): RIGHT TOTAL KNEE ARTHROPLASTY (Right) as a surgical intervention .  The patient's history has been reviewed, patient examined, no change in status, stable for surgery.  I have reviewed the patient's chart and labs.  Questions were answered to the patient's satisfaction.     Imraan Wendell JR,W D

## 2016-08-30 NOTE — Brief Op Note (Signed)
08/30/2016  9:37 AM  PATIENT:  Oscar Melendez  66 y.o. male  PRE-OPERATIVE DIAGNOSIS:  OA RIGHT KNEE  POST-OPERATIVE DIAGNOSIS:  OA Right Knee  PROCEDURE:  Procedure(s): RIGHT TOTAL KNEE ARTHROPLASTY (Right)  SURGEON:  Surgeon(s) and Role:    Frederico Hamman* Caffrey, Daniel, MD - Primary  PHYSICIAN ASSISTANT: Margart SicklesJoshua Shacola Schussler, PA-C  ASSISTANTS:    ANESTHESIA:   local, regional and spinal  EBL:  Total I/O In: 1000 [I.V.:1000] Out: -   BLOOD ADMINISTERED:none  DRAINS: none   LOCAL MEDICATIONS USED:  MARCAINE     SPECIMEN:  No Specimen  DISPOSITION OF SPECIMEN:  N/A  COUNTS:  YES  TOURNIQUET:   Total Tourniquet Time Documented: Thigh (Right) - 61 minutes Total: Thigh (Right) - 61 minutes   DICTATION: .Other Dictation: Dictation Number unknown  PLAN OF CARE: Admit to inpatient   PATIENT DISPOSITION:  PACU - hemodynamically stable.   Delay start of Pharmacological VTE agent (>24hrs) due to surgical blood loss or risk of bleeding: yes

## 2016-08-30 NOTE — Progress Notes (Signed)
Orthopedic Tech Progress Note Patient Details:  Oscar Melendez 1950-03-24 [id number]  CPM Right Knee CPM Right Knee: On Right Knee Flexion (Degrees): 90 Right Knee Extension (Degrees): 0 Additional Comments: trapeze bar patient helper   Nikki DomCrawford, Ebony Yorio 08/30/2016, 10:26 AM Viewed order from doctor's order list

## 2016-08-30 NOTE — Op Note (Signed)
NAME:  Oscar Melendez, Oscar Melendez                    ACCOUNT NO.:  MEDICAL RECORD NO.:  [id number]  LOCATION:                                 FACILITY:  PHYSICIAN:  [name], M.D.         DATE OF BIRTH:  DATE OF PROCEDURE:  08/30/2016 DATE OF DISCHARGE:                              OPERATIVE REPORT   PREOPERATIVE DIAGNOSIS:  Osteoarthritis, right knee with flexion contracture and varus deformity.  POSTOPERATIVE DIAGNOSIS:  Osteoarthritis, right knee with flexion contracture and varus deformity.  PROCEDURES PERFORMED:  Right total knee replacement.  Sigma cemented knee, size 5 femur, tibia with 41 mm all poly patella with 10 mm bearing.  SURGEON:  [name], M.D.  ASSISTANVincent Peyer:  [name], PA.  ANESTHESIA:  Spinal.  TOURNIQUET TIME:  60 minutes.  DESCRIPTION OF PROCEDURE:  Sterile prep and drape, exsanguination of leg, inflation to 350 mmHg.  Straight skin incision was made with medial parapatellar approach to the knee.  We cut an 11 mm 5 degree valgus cut on the femur.  Thereafter, cutting about 4 mm below the most diseased compartment of  the medial compartment with the extension gap being measured at 10 mm.  We sized the femur to be a size 5, placed in the all- in-1 cutting block in the appropriate degree of external rotation with anterior and posterior chamfer cuts.  The PCL was released and posterior osteophytes removed from the knee.  The flexion gap equaled the extension gap at 10 mm.  [name] was cut for the tibia, box cut for the femur.  Trial femur and tibia were placed followed by cutting all-poly trial on the patella leaving about 17-18 mm of native patella.  All components were placed.  Full extension was noted.  Resolution of the flexion contracture, varus deformity was noted as well.  Excellent stability, good balancing of the ligaments, no tendency for bearing dislocation or spin out.  The final components were prepared on the back table.  Cement was inserted in the  doughy state.  After pulsatile lavage,  we infiltrated the capsule and the subcutaneous tissues with Exparel and Marcaine mixture.  Used topical TXA.  Components were inserted in tibia followed by femur patella.  Elected to use a trial bearing while the cement hardened.  Cement was allowed to harden then the trial bearing was removed.  No excess cement was noted in the posterior aspect of the knee.  The tourniquet was released.  No excessive bleeding was noted. Final bearing was placed.  Closure was affected with #1 Ethibond, 2-0 Vicryl, and skin clips.  Taken to recovery room in stable condition.     [name], M.D.     WDC/MEDQ  D:  08/30/2016  T:  08/30/2016  Job:  [id number]

## 2016-08-30 NOTE — Anesthesia Procedure Notes (Signed)
Spinal  Patient location during procedure: OR Start time: 08/30/2016 7:30 AM End time: 08/30/2016 7:35 AM Staffing Anesthesiologist: Gaynelle AduFITZGERALD, Zayah Keilman Performed: anesthesiologist  Preanesthetic Checklist Completed: patient identified, surgical consent, pre-op evaluation, timeout performed, IV checked, risks and benefits discussed and monitors and equipment checked Spinal Block Patient position: sitting Prep: DuraPrep Patient monitoring: cardiac monitor, continuous pulse ox and blood pressure Approach: midline Location: L3-4 Injection technique: single-shot Needle Needle type: Pencan  Needle gauge: 24 G Needle length: 9 cm Assessment Sensory level: T8 Additional Notes Functioning IV was confirmed and monitors were applied. Sterile prep and drape, including hand hygiene and sterile gloves were used. The patient was positioned and the spine was prepped. The skin was anesthetized with lidocaine.  Free flow of clear CSF was obtained prior to injecting local anesthetic into the CSF.  The spinal needle aspirated freely following injection.  The needle was carefully withdrawn.  The patient tolerated the procedure well.

## 2016-08-30 NOTE — Interval H&P Note (Signed)
History and Physical Interval Note:  08/30/2016 7:30 AM  Oscar Melendez  has presented today for surgery, with the diagnosis of OA RIGHT KNEE  The various methods of treatment have been discussed with the patient and family. After consideration of risks, benefits and other options for treatment, the patient has consented to  Procedure(s): RIGHT TOTAL KNEE ARTHROPLASTY (Right) as a surgical intervention .  The patient's history has been reviewed, patient examined, no change in status, stable for surgery.  I have reviewed the patient's chart and labs.  Questions were answered to the patient's satisfaction.     Iren Whipp JR,W D

## 2016-08-30 NOTE — Anesthesia Procedure Notes (Signed)
Anesthesia Regional Block: Adductor canal block   Pre-Anesthetic Checklist: ,, timeout performed, Correct Patient, Correct Site, Correct Laterality, Correct Procedure, Correct Position, site marked, Risks and benefits discussed, pre-op evaluation,  At surgeon's request and post-op pain management  Laterality: Right  Prep: Maximum Sterile Barrier Precautions used, chloraprep       Needles:  Injection technique: Single-shot  Needle Type: Echogenic Stimulator Needle     Needle Length: 9cm  Needle Gauge: 21     Additional Needles:   Procedures: ultrasound guided,,,,,,,,  Narrative:  Start time: 08/30/2016 6:56 AM End time: 08/30/2016 7:06 AM Injection made incrementally with aspirations every 5 mL. Anesthesiologist: Gaynelle Melendez, Oscar Catalfamo  Additional Notes: 2% Lidocaine skin wheel.

## 2016-08-30 NOTE — Care Management Note (Signed)
Case Management Note  Patient Details  Name: Oscar Melendez MRN: [id number] Date of Birth: 13-Aug-1950  Subjective/Objective:   66 yr old male admitted with osteoarthritis of the right knee, patient s/p right total knee arthroplasty 08/30/16.                  Action/Plan: Patient was preoperatively setup with [facility], no changes. RW and CPM have been delivered to patient's home.     Expected Discharge Date:   pending               Expected Discharge Plan:  Home w Home Health Services  In-House Referral:  NA  Discharge planning Services  CM Consult  Post Acute Care Choice:  Durable Medical Equipment, Home Health Choice offered to:  Patient  DME Arranged:  Walker rolling, CPM DME Agency:  [employer]  HH Arranged:  PT HH Agency:  [facility]  Status of Service:  In process, will continue to follow  If discussed at Long Length of Stay Meetings, dates discussed:    Additional Comments:  Durenda GuthrieBrady, Gwendola Hornaday Naomi, RN 08/30/2016, 3:00 PM

## 2016-08-30 NOTE — H&P (View-Only) (Signed)
TOTAL KNEE ADMISSION H&P  Patient is being admitted for right total knee arthroplasty.  Subjective:  Chief Complaint:right knee pain.  HPI: Oscar Melendez, 66 y.o. male, has a history of pain and functional disability in the right knee due to arthritis and has failed non-surgical conservative treatments for greater than 12 weeks to includeNSAID's and/or analgesics, corticosteriod injections, viscosupplementation injections and activity modification.  Onset of symptoms was gradual, starting 10 years ago with progressively worsening course since that time. The patient noted no past surgery on the right knee(s).  Patient currently rates pain in the right knee(s) at 10 out of 10 with activity. Patient has night pain, worsening of pain with activity and weight bearing, pain that interferes with activities of daily living, pain with passive range of motion, crepitus and joint swelling.  Patient has evidence of periarticular osteophytes and joint space narrowing by imaging studies.  There is no active infection.  Patient Active Problem List   Diagnosis Date Noted  . CAD (coronary artery disease) 07/09/2016   Past Medical History:  Diagnosis Date  . Atherosclerotic heart disease of native coronary artery without angina pectoris   . Atrial fibrillation (HCC)   . Chronic knee pain   . Dermatophytosis of nail   . H/O inguinal hernia repair   . Hyperlipemia   . Hypertension   . Impotence of organic origin   . Incomplete RBBB   . Morbid obesity (HCC)   . Onychomycosis   . Osteoarthrosis   . RCA occlusion (HCC)   . Transient cerebral ischemia     Past Surgical History:  Procedure Laterality Date  . HERNIA REPAIR       (Not in a hospital admission) Allergies  Allergen Reactions  . Dye Fdc Red [Red Dye]     BURNING SENSATION    Social History  Substance Use Topics  . Smoking status: Never Smoker  . Smokeless tobacco: Never Used  . Alcohol use No    Family History  Problem Relation  Age of Onset  . Heart disease Mother   . Cancer Brother      Review of Systems  HENT: Positive for hearing loss.   Gastrointestinal: Positive for blood in stool and melena.  Genitourinary: Positive for hematuria.  Musculoskeletal: Positive for joint pain.  All other systems reviewed and are negative.   Objective:  Physical Exam  Constitutional: He is oriented to person, place, and time. He appears well-developed and well-nourished. No distress.  HENT:  Head: Normocephalic and atraumatic.  Nose: Nose normal.  Eyes: Conjunctivae and EOM are normal. Pupils are equal, round, and reactive to light.  Neck: Normal range of motion. Neck supple.  Cardiovascular: Normal rate, regular rhythm, normal heart sounds and intact distal pulses.   Respiratory: Effort normal and breath sounds normal. No respiratory distress. He has no wheezes.  GI: Soft. Bowel sounds are normal. He exhibits no distension. There is no tenderness.  Musculoskeletal:       Right knee: He exhibits swelling. He exhibits normal range of motion. Tenderness found.  Lymphadenopathy:    He has no cervical adenopathy.  Neurological: He is alert and oriented to person, place, and time.  Skin: Skin is warm and dry. No rash noted. No erythema.  Psychiatric: He has a normal mood and affect. His behavior is normal.    Vital signs in last 24 hours: @VSRANGES @  Labs:   Estimated body mass index is 37.16 kg/m as calculated from the following:   Height as  of 07/09/16: 6' (1.829 m).   Weight as of 07/09/16: 124.3 kg (274 lb).   Imaging Review Plain radiographs demonstrate moderate degenerative joint disease of the right knee(s). The overall alignment issignificant varus. The bone quality appears to be good for age and reported activity level.  Assessment/Plan:  End stage arthritis, right knee   The patient history, physical examination, clinical judgment of the provider and imaging studies are consistent with end stage  degenerative joint disease of the right knee(s) and total knee arthroplasty is deemed medically necessary. The treatment options including medical management, injection therapy arthroscopy and arthroplasty were discussed at length. The risks and benefits of total knee arthroplasty were presented and reviewed. The risks due to aseptic loosening, infection, stiffness, patella tracking problems, thromboembolic complications and other imponderables were discussed. The patient acknowledged the explanation, agreed to proceed with the plan and consent was signed. Patient is being admitted for inpatient treatment for surgery, pain control, PT, OT, prophylactic antibiotics, VTE prophylaxis, progressive ambulation and ADL's and discharge planning. The patient is planning to be discharged home with home health services

## 2016-08-30 NOTE — Transfer of Care (Signed)
Immediate Anesthesia Transfer of Care Note  Patient: Oscar Melendez  Procedure(s) Performed: Procedure(s): RIGHT TOTAL KNEE ARTHROPLASTY (Right)  Patient Location: PACU  Anesthesia Type:Spinal and MAC combined with regional for post-op pain  Level of Consciousness: awake, alert , oriented and patient cooperative  Airway & Oxygen Therapy: Patient Spontanous Breathing and Patient connected to nasal cannula oxygen  Post-op Assessment: Report given to RN and Post -op Vital signs reviewed and stable  Post vital signs: Reviewed and stable  Last Vitals:  Vitals:   08/30/16 0559  BP: 137/85  Pulse: 65  Resp: 16  Temp: 36.6 C    Last Pain:  Vitals:   08/30/16 0559  TempSrc: Oral         Complications: No apparent anesthesia complications

## 2016-08-30 NOTE — Anesthesia Postprocedure Evaluation (Signed)
Anesthesia Post Note  Patient: Oscar Melendez  Procedure(s) Performed: Procedure(s) (LRB): RIGHT TOTAL KNEE ARTHROPLASTY (Right)     Patient location during evaluation: PACU Anesthesia Type: Spinal and Regional Level of consciousness: awake and alert Pain management: pain level controlled Vital Signs Assessment: post-procedure vital signs reviewed and stable Respiratory status: spontaneous breathing and respiratory function stable Cardiovascular status: blood pressure returned to baseline and stable Postop Assessment: spinal receding Anesthetic complications: no    Last Vitals:  Vitals:   08/30/16 1100 08/30/16 1115  BP: 124/90 116/71  Pulse: 62 61  Resp: 11 16  Temp:      Last Pain:  Vitals:   08/30/16 1100  TempSrc:   PainSc: 0-No pain                 Cornesha Radziewicz,W. EDMOND

## 2016-08-30 NOTE — Evaluation (Signed)
Physical Therapy Evaluation Patient Details Name: Oscar Melendez MRN: [id number] DOB: 1950-10-15 Today's Date: 08/30/2016   History of Present Illness  Pt is s/p elective R TKA secondary to R knee OA. PMH includes CAD, a fib, HTN, RCA occlusion, CVA, and obesity.   Clinical Impression  Pt s/p surgery above with deficits below. PTA, pt was independent for functional mobility. Upon evaluation, pt limited by post op pain and weakness, as well as decreased balance. Pt with some knee buckling during ambulation despite use of R KI, therefore distance limited. Required min to min guard assist for safety with mobility. Pt reports wife will be able to assist 24/7 upon d/c home. Pt unsure of follow up recommendations. Will continue to follow acutely to maximize functional mobility independence.     Follow Up Recommendations DC plan and follow up therapy as arranged by surgeon;Supervision/Assistance - 24 hour    Equipment Recommendations  None recommended by PT    Recommendations for Other Services       Precautions / Restrictions Precautions Precautions: Knee Precaution Booklet Issued: Yes (comment) Precaution Comments: Reviewed supine ther ex handout with pt  Required Braces or Orthoses: Knee Immobilizer - Right Knee Immobilizer - Right: Other (comment) (until tomorrow ) Restrictions Weight Bearing Restrictions: Yes RLE Weight Bearing: Weight bearing as tolerated      Mobility  Bed Mobility Overal bed mobility: Needs Assistance Bed Mobility: Supine to Sit     Supine to sit: Supervision     General bed mobility comments: Supervision for safety. Use of bed rails and elevated HOB. Required extended time to complete.   Transfers Overall transfer level: Needs assistance Equipment used: Rolling walker (2 wheeled) Transfers: Sit to/from Stand Sit to Stand: Min assist;From elevated surface         General transfer comment: Required elevated surface and min A for lift assist and  steadying. VC's for hand placement.   Ambulation/Gait Ambulation/Gait assistance: Min guard;Min assist Ambulation Distance (Feet): 10 Feet Assistive device: Rolling walker (2 wheeled) Gait Pattern/deviations: Step-to pattern;Decreased step length - right;Decreased weight shift to right;Antalgic;Trunk flexed Gait velocity: Decreased Gait velocity interpretation: Below normal speed for age/gender General Gait Details: Slow, antalgic gait secondary to post op pain and weakness. Some knee bucling noted even with use of KI, therefore ambulation distance limited. Min guard to min A for steadying. Verbal cues for sequencing with RW.   Stairs            Wheelchair Mobility    Modified Rankin (Stroke Patients Only)       Balance Overall balance assessment: Needs assistance Sitting-balance support: No upper extremity supported;Feet supported Sitting balance-Leahy Scale: Good     Standing balance support: Bilateral upper extremity supported;During functional activity Standing balance-Leahy Scale: Poor Standing balance comment: Reliant on RW for stability.                              Pertinent Vitals/Pain Pain Assessment: 0-10 Pain Score: 4  Pain Location: R knee Pain Descriptors / Indicators: Aching;Sore;Operative site guarding Pain Intervention(s): Limited activity within patient's tolerance;Monitored during session;Repositioned    Home Living Family/patient expects to be discharged to:: Private residence Living Arrangements: Spouse/significant other;Children Available Help at Discharge: Family;Available 24 hours/day Type of Home: House Home Access: Ramped entrance     Home Layout: One level Home Equipment: Walker - 2 wheels;Other (comment);Shower seat (CPM)      Prior Function Level of Independence: Independent  Hand Dominance   Dominant Hand: Right    Extremity/Trunk Assessment   Upper Extremity Assessment Upper Extremity  Assessment: Defer to OT evaluation    Lower Extremity Assessment Lower Extremity Assessment: RLE deficits/detail RLE Deficits / Details: Numbness on balls of feet. Deficits consistent with post op pain and weakness. Able to do exercises below.     Cervical / Trunk Assessment Cervical / Trunk Assessment: Normal  Communication   Communication: No difficulties  Cognition Arousal/Alertness: Awake/alert Behavior During Therapy: WFL for tasks assessed/performed Overall Cognitive Status: Within Functional Limits for tasks assessed                                        General Comments General comments (skin integrity, edema, etc.): Pt's wife present during session.     Exercises Total Joint Exercises Ankle Circles/Pumps: AROM;Both;10 reps;Supine Quad Sets: AROM;Right;10 reps;Supine Towel Squeeze: AROM;Both;Supine;10 reps Short Arc Quad: AROM;Right;10 reps;Supine Heel Slides: AROM;Right;10 reps;Supine Hip ABduction/ADduction: AROM;Right;10 reps;Supine   Assessment/Plan    PT Assessment Patient needs continued PT services  PT Problem List Decreased strength;Decreased range of motion;Decreased activity tolerance;Decreased balance;Decreased mobility;Decreased knowledge of use of DME;Decreased knowledge of precautions;Pain;Impaired sensation       PT Treatment Interventions DME instruction;Gait training;Functional mobility training;Therapeutic activities;Therapeutic exercise;Balance training;Neuromuscular re-education;Patient/family education    PT Goals (Current goals can be found in the Care Plan section)  Acute Rehab PT Goals Patient Stated Goal: to go home  PT Goal Formulation: With patient/family Time For Goal Achievement: 09/06/16 Potential to Achieve Goals: Good    Frequency 7X/week   Barriers to discharge        Co-evaluation               AM-PAC PT "6 Clicks" Daily Activity  Outcome Measure Difficulty turning over in bed (including adjusting  bedclothes, sheets and blankets)?: A Little Difficulty moving from lying on back to sitting on the side of the bed? : A Little Difficulty sitting down on and standing up from a chair with arms (e.g., wheelchair, bedside commode, etc,.)?: Total Help needed moving to and from a bed to chair (including a wheelchair)?: A Little Help needed walking in hospital room?: A Little Help needed climbing 3-5 steps with a railing? : A Lot 6 Click Score: 15    End of Session Equipment Utilized During Treatment: Gait belt;Right knee immobilizer Activity Tolerance: Patient limited by pain Patient left: in chair;with call bell/phone within reach;with family/visitor present Nurse Communication: Mobility status PT Visit Diagnosis: Other abnormalities of gait and mobility (R26.89);Pain Pain - Right/Left: Right Pain - part of body: Knee    Time: 1731-1755 PT Time Calculation (min) (ACUTE ONLY): 24 min   Charges:   PT Evaluation $PT Eval Low Complexity: 1 Procedure PT Treatments $Gait Training: 8-22 mins   PT G Codes:        Gladys DammeBrittany Catalino Plascencia, PT, DPT  Acute Rehabilitation Services  Pager: [phone]   Lehman PromBrittany S Havana Baldwin 08/30/2016, 6:49 PM

## 2016-08-31 LAB — BASIC METABOLIC PANEL
ANION GAP: 8 (ref 5–15)
BUN: 13 mg/dL (ref 6–20)
CALCIUM: 8.4 mg/dL — AB (ref 8.9–10.3)
CO2: 27 mmol/L (ref 22–32)
Chloride: 99 mmol/L — ABNORMAL LOW (ref 101–111)
Creatinine, Ser: 1.31 mg/dL — ABNORMAL HIGH (ref 0.61–1.24)
GFR, EST NON AFRICAN AMERICAN: 55 mL/min — AB (ref >60)
GLUCOSE: 155 mg/dL — AB (ref 65–99)
POTASSIUM: 3.8 mmol/L (ref 3.5–5.1)
Sodium: 134 mmol/L — ABNORMAL LOW (ref 135–145)

## 2016-08-31 LAB — CBC
HEMATOCRIT: 44.7 % (ref 39.0–52.0)
Hemoglobin: 14.1 g/dL (ref 13.0–17.0)
MCH: 29.1 pg (ref 26.0–34.0)
MCHC: 31.5 g/dL (ref 30.0–36.0)
MCV: 92.2 fL (ref 78.0–100.0)
PLATELETS: 147 10*3/uL — AB (ref 150–400)
RBC: 4.85 MIL/uL (ref 4.22–5.81)
RDW: 14.5 % (ref 11.5–15.5)
WBC: 7.8 10*3/uL (ref 4.0–10.5)

## 2016-08-31 MED ORDER — TRAMADOL HCL 50 MG PO TABS
50.0000 mg | ORAL_TABLET | Freq: Four times a day (QID) | ORAL | Status: DC | PRN
  Administered 2016-08-31 – 2016-09-01 (×2): 100 mg via ORAL
  Filled 2016-08-31 (×2): qty 2

## 2016-08-31 NOTE — Progress Notes (Signed)
Physical Therapy Treatment Patient Details Name: Oscar Melendez MRN: [id number] DOB: Mar 10, 1951 Today's Date: 08/31/2016    History of Present Illness Pt is s/p elective R TKA secondary to R knee OA. PMH includes CAD, AFIB, HTN, RCA occlusion, CVA, and obesity.     PT Comments    Patient is making progress. He had less instability with gait. He did better with bed mobility. Current plan remains appropriate.    Follow Up Recommendations  DC plan and follow up therapy as arranged by surgeon;Supervision/Assistance - 24 hour     Equipment Recommendations  None recommended by PT    Recommendations for Other Services       Precautions / Restrictions Precautions Precautions: Knee Precaution Booklet Issued: No Precaution Comments: educated on knee precautions Required Braces or Orthoses: Knee Immobilizer - Right Knee Immobilizer - Right: Other (comment) Restrictions Weight Bearing Restrictions: Yes RLE Weight Bearing: Weight bearing as tolerated    Mobility  Bed Mobility Overal bed mobility: Needs Assistance Bed Mobility: Sit to Supine     Supine to sit: Min assist     General bed mobility comments: Patient required min assistance to get leg back into bed.  Transfers Overall transfer level: Needs assistance Equipment used: Rolling walker (2 wheeled) Transfers: Sit to/from Stand Sit to Stand: Min guard         General transfer comment: guarding to stand. Imprved abiltiy to transfer sit to stand   Ambulation/Gait Ambulation/Gait assistance: Min guard;Min assist Ambulation Distance (Feet): 35 Feet Assistive device: Rolling walker (2 wheeled) Gait Pattern/deviations: Step-to pattern;Decreased step length - right;Decreased weight shift to right;Antalgic;Trunk flexed Gait velocity: Decreased   General Gait Details: improved stability with gait and improved gait speed.    Stairs            Wheelchair Mobility    Modified Rankin (Stroke Patients Only)        Balance Overall balance assessment: Needs assistance Sitting-balance support: No upper extremity supported;Feet supported Sitting balance-Leahy Scale: Good     Standing balance support: Bilateral upper extremity supported;During functional activity Standing balance-Leahy Scale: Poor Standing balance comment: Reliant on RW for stability.                             Cognition Arousal/Alertness: Awake/alert Behavior During Therapy: WFL for tasks assessed/performed;Flat affect Overall Cognitive Status: Within Functional Limits for tasks assessed                                        Exercises      General Comments        Pertinent Vitals/Pain Pain Assessment: 0-10 Pain Score: 8  Pain Location: right knee Pain Descriptors / Indicators: Other (Comment) Pain Intervention(s): Limited activity within patient's tolerance    Home Living                      Prior Function            PT Goals (current goals can now be found in the care plan section) Acute Rehab PT Goals Patient Stated Goal: not stated PT Goal Formulation: With patient/family Time For Goal Achievement: 09/06/16 Potential to Achieve Goals: Good    Frequency    7X/week      PT Plan Current plan remains appropriate    Co-evaluation  AM-PAC PT "6 Clicks" Daily Activity  Outcome Measure  Difficulty turning over in bed (including adjusting bedclothes, sheets and blankets)?: A Little Difficulty moving from lying on back to sitting on the side of the bed? : A Little Difficulty sitting down on and standing up from a chair with arms (e.g., wheelchair, bedside commode, etc,.)?: Total Help needed moving to and from a bed to chair (including a wheelchair)?: A Little Help needed walking in hospital room?: A Little Help needed climbing 3-5 steps with a railing? : A Lot 6 Click Score: 15    End of Session Equipment Utilized During Treatment: Gait  belt;Right knee immobilizer Activity Tolerance: Patient limited by pain Patient left: in chair;with call bell/phone within reach;with family/visitor present Nurse Communication: Mobility status PT Visit Diagnosis: Other abnormalities of gait and mobility (R26.89);Pain Pain - Right/Left: Right Pain - part of body: Knee     Time: 0155-0210 PT Time Calculation (min) (ACUTE ONLY): 15 min  Charges:  $Gait Training: 8-22 mins                    G Codes:        Dessie Comaavid J Luisangel Wainright PT DPT  08/31/2016, 3:43 PM

## 2016-08-31 NOTE — Progress Notes (Signed)
Orthopedic Trauma Service Progress Note weekend Coverage    Subjective:  Doing ok this am Pain moderate Did need a dose of IV meds not too long ago to get some relief   Last BM Thursday Voiding without difficulty  Has been in CPM for 2 hours already this am  Denies any issues     Review of Systems  Constitutional: Negative for chills and fever.  Respiratory: Negative for shortness of breath and wheezing.   Cardiovascular: Negative for chest pain and palpitations.  Gastrointestinal: Negative for nausea and vomiting.  Neurological: Negative for tingling and sensory change.    Objective:   VITALS:   Vitals:   08/30/16 1359 08/30/16 2029 08/30/16 2337 08/31/16 0504  BP: 131/81 110/71 94/63 105/66  Pulse: 64 83 73 89  Resp:      Temp: 97.5 F (36.4 C) 99.1 F (37.3 C) 97.7 F (36.5 C) 98.2 F (36.8 C)  TempSrc: Oral Oral Oral Oral  SpO2: 94% 94% 97% 96%    Intake/Output      06/15 0701 - 06/16 0700 06/16 0701 - 06/17 0700   P.O. 240 240   I.V. 2645    IV Piggyback 50    Total Intake 2935 240   Urine 1250 350   Blood 100    Total Output 1350 350   Net +1585 -110          LABS  Results for orders placed or performed during the hospital encounter of 08/30/16 (from the past 24 hour(s))  CBC     Status: Abnormal   Collection Time: 08/31/16  7:18 AM  Result Value Ref Range   WBC 7.8 4.0 - 10.5 K/uL   RBC 4.85 4.22 - 5.81 MIL/uL   Hemoglobin 14.1 13.0 - 17.0 g/dL   HCT 08.644.7 57.839.0 - 46.952.0 %   MCV 92.2 78.0 - 100.0 fL   MCH 29.1 26.0 - 34.0 pg   MCHC 31.5 30.0 - 36.0 g/dL   RDW 62.914.5 52.811.5 - 41.315.5 %   Platelets 147 (L) 150 - 400 K/uL  Basic metabolic panel     Status: Abnormal   Collection Time: 08/31/16  7:18 AM  Result Value Ref Range   Sodium 134 (L) 135 - 145 mmol/L   Potassium 3.8 3.5 - 5.1 mmol/L   Chloride 99 (L) 101 - 111 mmol/L   CO2 27 22 - 32 mmol/L   Glucose, Bld 155 (H) 65 - 99 mg/dL   BUN 13 6 - 20 mg/dL   Creatinine,  Ser 2.441.31 (H) 0.61 - 1.24 mg/dL   Calcium 8.4 (L) 8.9 - 10.3 mg/dL   GFR calc non Af Amer 55 (L) >60 mL/min   GFR calc Af Amer >60 >60 mL/min   Anion gap 8 5 - 15     PHYSICAL EXAM:   Gen: awake and alert, sitting up in chair, appears well Lungs: clear anterior fields Cardiac: RRR Abd: + BS, NTD Ext:       Right lower extremity   Dressing c/d/i  Ext warm  + DP pulse  No DCT   Compartments soft  EHL, FHL, AT, PT, peroneals, gastroc motor intact  DPN, SPN, TN sensation intact   Assessment/Plan: 1 Day Post-Op   Active Problems:   Primary localized osteoarthritis of right knee   Anti-infectives    Start     Dose/Rate Route Frequency Ordered Stop   08/30/16 1400  ceFAZolin (ANCEF) IVPB 1 g/50 mL premix     1  g 100 mL/hr over 30 Minutes Intravenous Every 6 hours 08/30/16 1153 08/30/16 2046   08/30/16 0700  ceFAZolin (ANCEF) 3 g in dextrose 5 % 50 mL IVPB     3 g 130 mL/hr over 30 Minutes Intravenous To ShortStay Surgical 08/29/16 0929 08/30/16 1204    .  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  WBAT  PT/OT  CPM 6 hrs/day, can divide up into smaller sessions  Bone foam when at rest   Ice and elevate    - Pain management:  Continue with current regimen   - ABL anemia/Hemodynamics  Stable  - Medical issues   Continue home meds  Continue to hold ACEI as pts creatinine mildly elevate   Continue IVF   - DVT/PE prophylaxis:  Apixaban  - ID:   periop abx completed  - Activity:  Continue therapies  - FEN/GI prophylaxis/Foley/Lines:  Reg diet  Continue IVF  - Dispo:  Continue therapies  Possible dc home tomorrow    Pt has all equipment     Mearl Latin, PA-C [facility] [phone] (P) [phone] (O) 08/31/2016, 9:30 AM

## 2016-08-31 NOTE — Progress Notes (Signed)
Orthopedic Tech Progress Note Patient Details:  Oscar AverJames D Melendez 1950/06/23 [id number]  Ortho Devices Ortho Device/Splint Location: foot roll   Saul FordyceJennifer C Damian Buckles 08/31/2016, 2:56 PM

## 2016-08-31 NOTE — Discharge Instructions (Signed)
INSTRUCTIONS AFTER JOINT REPLACEMENT  ° °o Remove items at home which could result in a fall. This includes throw rugs or furniture in walking pathways °o ICE to the affected joint every three hours while awake for 30 minutes at a time, for at least the first 3-5 days, and then as needed for pain and swelling.  Continue to use ice for pain and swelling. You may notice swelling that will progress down to the foot and ankle.  This is normal after surgery.  Elevate your leg when you are not up walking on it.   °o Continue to use the breathing machine you got in the hospital (incentive spirometer) which will help keep your temperature down.  It is common for your temperature to cycle up and down following surgery, especially at night when you are not up moving around and exerting yourself.  The breathing machine keeps your lungs expanded and your temperature down. ° ° °DIET:  As you were doing prior to hospitalization, we recommend a well-balanced diet. ° °DRESSING / WOUND CARE / SHOWERING ° °You may change your dressing 3-5 days after surgery.  Then change the dressing every day with sterile gauze.  Please use good hand washing techniques before changing the dressing.  Do not use any lotions or creams on the incision until instructed by your surgeon. ° °ACTIVITY ° °o Increase activity slowly as tolerated, but follow the weight bearing instructions below.   °o No driving for 6 weeks or until further direction given by your physician.  You cannot drive while taking narcotics.  °o No lifting or carrying greater than 10 lbs. until further directed by your surgeon. °o Avoid periods of inactivity such as sitting longer than an hour when not asleep. This helps prevent blood clots.  °o You may return to work once you are authorized by your doctor.  ° ° ° °WEIGHT BEARING  ° °Weight bearing as tolerated with assist device (walker, cane, etc) as directed, use it as long as suggested by your surgeon or therapist, typically at  least 4-6 weeks. ° ° °EXERCISES ° °Results after joint replacement surgery are often greatly improved when you follow the exercise, range of motion and muscle strengthening exercises prescribed by your doctor. Safety measures are also important to protect the joint from further injury. Any time any of these exercises cause you to have increased pain or swelling, decrease what you are doing until you are comfortable again and then slowly increase them. If you have problems or questions, call your caregiver or physical therapist for advice.  ° °Rehabilitation is important following a joint replacement. After just a few days of immobilization, the muscles of the leg can become weakened and shrink (atrophy).  These exercises are designed to build up the tone and strength of the thigh and leg muscles and to improve motion. Often times heat used for twenty to thirty minutes before working out will loosen up your tissues and help with improving the range of motion but do not use heat for the first two weeks following surgery (sometimes heat can increase post-operative swelling).  ° °These exercises can be done on a training (exercise) mat, on the floor, on a table or on a bed. Use whatever works the best and is most comfortable for you.    Use music or television while you are exercising so that the exercises are a pleasant break in your day. This will make your life better with the exercises acting as a break   in your routine that you can look forward to.   Perform all exercises about fifteen times, three times per day or as directed.  You should exercise both the operative leg and the other leg as well. ° °Exercises include: °  °• Quad Sets - Tighten up the muscle on the front of the thigh (Quad) and hold for 5-10 seconds.   °• Straight Leg Raises - With your knee straight (if you were given a brace, keep it on), lift the leg to 60 degrees, hold for 3 seconds, and slowly lower the leg.  Perform this exercise against  resistance later as your leg gets stronger.  °• Leg Slides: Lying on your back, slowly slide your foot toward your buttocks, bending your knee up off the floor (only go as far as is comfortable). Then slowly slide your foot back down until your leg is flat on the floor again.  °• Angel Wings: Lying on your back spread your legs to the side as far apart as you can without causing discomfort.  °• Hamstring Strength:  Lying on your back, push your heel against the floor with your leg straight by tightening up the muscles of your buttocks.  Repeat, but this time bend your knee to a comfortable angle, and push your heel against the floor.  You may put a pillow under the heel to make it more comfortable if necessary.  ° °A rehabilitation program following joint replacement surgery can speed recovery and prevent re-injury in the future due to weakened muscles. Contact your doctor or a physical therapist for more information on knee rehabilitation.  ° ° °CONSTIPATION ° °Constipation is defined medically as fewer than three stools per week and severe constipation as less than one stool per week.  Even if you have a regular bowel pattern at home, your normal regimen is likely to be disrupted due to multiple reasons following surgery.  Combination of anesthesia, postoperative narcotics, change in appetite and fluid intake all can affect your bowels.  ° °YOU MUST use at least one of the following options; they are listed in order of increasing strength to get the job done.  They are all available over the counter, and you may need to use some, POSSIBLY even all of these options:   ° °Drink plenty of fluids (prune juice may be helpful) and high fiber foods °Colace 100 mg by mouth twice a day  °Senokot for constipation as directed and as needed Dulcolax (bisacodyl), take with full glass of water  °Miralax (polyethylene glycol) once or twice a day as needed. ° °If you have tried all these things and are unable to have a bowel  movement in the first 3-4 days after surgery call either your surgeon or your primary doctor.   ° °If you experience loose stools or diarrhea, hold the medications until you stool forms back up.  If your symptoms do not get better within 1 week or if they get worse, check with your doctor.  If you experience "the worst abdominal pain ever" or develop nausea or vomiting, please contact the office immediately for further recommendations for treatment. ° ° °ITCHING:  If you experience itching with your medications, try taking only a single pain pill, or even half a pain pill at a time.  You can also use Benadryl over the counter for itching or also to help with sleep.  ° °TED HOSE STOCKINGS:  Use stockings on both legs until for at least 2 weeks or as   directed by physician office. They may be removed at night for sleeping. ° °MEDICATIONS:  See your medication summary on the “After Visit Summary” that nursing will review with you.  You may have some home medications which will be placed on hold until you complete the course of blood thinner medication.  It is important for you to complete the blood thinner medication as prescribed. ° °PRECAUTIONS:  If you experience chest pain or shortness of breath - call 911 immediately for transfer to the hospital emergency department.  ° °If you develop a fever greater that 101 F, purulent drainage from wound, increased redness or drainage from wound, foul odor from the wound/dressing, or calf pain - CONTACT YOUR SURGEON.   °                                                °FOLLOW-UP APPOINTMENTS:  If you do not already have a post-op appointment, please call the office for an appointment to be seen by your surgeon.  Guidelines for how soon to be seen are listed in your “After Visit Summary”, but are typically between 1-4 weeks after surgery. ° °OTHER INSTRUCTIONS:  ° °Knee Replacement:  Do not place pillow under knee, focus on keeping the knee straight while resting. CPM  instructions: 0-90 degrees, 2 hours in the morning, 2 hours in the afternoon, and 2 hours in the evening. Place foam block, curve side up under heel at all times except when in CPM or when walking.  DO NOT modify, tear, cut, or change the foam block in any way. ° °MAKE SURE YOU:  °• Understand these instructions.  °• Get help right away if you are not doing well or get worse.  ° ° °Thank you for letting us be a part of your medical care team.  It is a privilege we respect greatly.  We hope these instructions will help you stay on track for a fast and full recovery!  ° ° ° °Information on my medicine - ELIQUIS® (apixaban) ° °This medication education was reviewed with me or my healthcare representative as part of my discharge preparation.  The pharmacist that spoke with me during my hospital stay was:  Oriana Horiuchi Dien, RPH ° °Why was Eliquis® prescribed for you? °Eliquis® was prescribed for you to reduce the risk of blood clots forming after orthopedic surgery.   ° °What do You need to know about Eliquis®? °Take your Eliquis® TWICE DAILY - one tablet in the morning and one tablet in the evening with or without food.  It would be best to take the dose about the same time each day. ° °If you have difficulty swallowing the tablet whole please discuss with your pharmacist how to take the medication safely. ° °Take Eliquis® exactly as prescribed by your doctor and DO NOT stop taking Eliquis® without talking to the doctor who prescribed the medication.  Stopping without other medication to take the place of Eliquis® may increase your risk of developing a clot. ° °After discharge, you should have regular check-up appointments with your healthcare provider that is prescribing your Eliquis®. ° °What do you do if you miss a dose? °If a dose of ELIQUIS® is not taken at the scheduled time, take it as soon as possible on the same day and twice-daily administration should be resumed.  The dose should not   be doubled to make up for  a missed dose.  Do not take more than one tablet of ELIQUIS at the same time. ° °Important Safety Information °A possible side effect of Eliquis® is bleeding. You should call your healthcare provider right away if you experience any of the following: °? Bleeding from an injury or your nose that does not stop. °? Unusual colored urine (red or dark brown) or unusual colored stools (red or black). °? Unusual bruising for unknown reasons. °? A serious fall or if you hit your head (even if there is no bleeding). ° °Some medicines may interact with Eliquis® and might increase your risk of bleeding or clotting while on Eliquis®. To help avoid this, consult your healthcare provider or pharmacist prior to using any new prescription or non-prescription medications, including herbals, vitamins, non-steroidal anti-inflammatory drugs (NSAIDs) and supplements. ° °This website has more information on Eliquis® (apixaban): http://www.eliquis.com/eliquis/home ° °

## 2016-08-31 NOTE — Evaluation (Signed)
Occupational Therapy Evaluation Patient Details Name: Oscar Melendez MRN: [id number] DOB: 1950/12/07 Today's Date: 08/31/2016    History of Present Illness Pt is s/p elective R TKA secondary to R knee OA. PMH includes CAD, AFIB, HTN, RCA occlusion, CVA, and obesity.    Clinical Impression   Pt s/p above. Pt independent with ADLs, PTA. Feel pt will benefit from acute OT to increase independence prior to d/c.     Follow Up Recommendations  DC plan and follow up therapy as arranged by surgeon;Supervision/Assistance - 24 hour    Equipment Recommendations  Other (comment) (AE if wanted)    Recommendations for Other Services       Precautions / Restrictions Precautions Precautions: Knee Precaution Booklet Issued: No Precaution Comments: educated on knee precautions Required Braces or Orthoses: Knee Immobilizer - Right Restrictions Weight Bearing Restrictions: Yes RLE Weight Bearing: Weight bearing as tolerated      Mobility Bed Mobility Overal bed mobility: Needs Assistance Bed Mobility: Supine to Sit     Supine to sit: Min assist     General bed mobility comments: assist with Rt LE. Educated on use of sheet to help with Rt LE.  Transfers Overall transfer level: Needs assistance Equipment used: Rolling walker (2 wheeled) Transfers: Sit to/from Stand Sit to Stand: Min assist;From elevated surface         General transfer comment: cued for hand placement.    Balance    Min guard to take a few steps to chair with RW.                                       ADL either performed or assessed with clinical judgement   ADL Overall ADL's : Needs assistance/impaired                     Lower Body Dressing: Moderate assistance;Sit to/from stand   Toilet Transfer: Minimal assistance;Ambulation;RW (sit to stand from bed; took some steps from bed to chair)           Functional mobility during ADLs: Minimal assistance;Rolling walker (Min  A-sit to stand; Min guard-for ambulation) General ADL Comments: Educated on safety. Educated on LB dressing technique and mentioned use of reacher.      Vision         Perception     Praxis      Pertinent Vitals/Pain Pain Assessment: 0-10 Pain Score:  (2-3 when in bed) Pain Location: right knee Pain Descriptors / Indicators: Other (Comment) (steady, low grade) Pain Intervention(s): Monitored during session;Repositioned;Other (comment) (notified nurse that pt wanted to see her)   O2 dropped to around 90 without O2 on but trended up-kept O2 off and notified nurse.      Hand Dominance Right   Extremity/Trunk Assessment Upper Extremity Assessment Upper Extremity Assessment: Overall WFL for tasks assessed   Lower Extremity Assessment Lower Extremity Assessment: Defer to PT evaluation       Communication Communication Communication: No difficulties   Cognition Arousal/Alertness: Awake/alert Behavior During Therapy: WFL for tasks assessed/performed;Flat affect Overall Cognitive Status: Within Functional Limits for tasks assessed                                     General Comments       Exercises     Shoulder Instructions  Home Living Family/patient expects to be discharged to:: Private residence Living Arrangements: Spouse/significant other;Children Available Help at Discharge: Family;Available 24 hours/day Type of Home: House Home Access: Ramped entrance     Home Layout: One level     Bathroom Shower/Tub: Walk-in shower;Tub/shower unit   Bathroom Toilet: Handicapped height (chair height)     Home Equipment: Walker - 2 wheels;Other (comment);Bedside commode (CPM)          Prior Functioning/Environment Level of Independence: Independent                 OT Problem List: Decreased strength;Decreased activity tolerance;Decreased range of motion;Impaired balance (sitting and/or standing);Decreased knowledge of use of DME or  AE;Decreased knowledge of precautions;Pain      OT Treatment/Interventions: Self-care/ADL training;DME and/or AE instruction;Therapeutic activities;Balance training;Patient/family education    OT Goals(Current goals can be found in the care plan section) Acute Rehab OT Goals Patient Stated Goal: not stated OT Goal Formulation: With patient Time For Goal Achievement: 09/07/16 Potential to Achieve Goals: Good ADL Goals Pt Will Perform Lower Body Dressing: sit to/from stand;with set-up;with supervision (with or without AE) Pt Will Transfer to Toilet: with supervision;ambulating;with set-up;bedside commode (3 in 1 over commode) Pt Will Perform Tub/Shower Transfer: Shower transfer;with set-up;with supervision;ambulating;3 in 1;rolling walker  OT Frequency: Min 2X/week   Barriers to D/C:            Co-evaluation              AM-PAC PT "6 Clicks" Daily Activity     Outcome Measure Help from another person eating meals?: None Help from another person taking care of personal grooming?: A Little Help from another person toileting, which includes using toliet, bedpan, or urinal?: A Little Help from another person bathing (including washing, rinsing, drying)?: A Little Help from another person to put on and taking off regular upper body clothing?: A Little Help from another person to put on and taking off regular lower body clothing?: A Lot 6 Click Score: 18   End of Session Equipment Utilized During Treatment: Gait belt;Rolling walker;Right knee immobilizer;Other (comment) (took O2 off of pt towards beginning of session) CPM Right Knee CPM Right Knee: Off Nurse Communication: Other (comment) (O2 sats and pt wanted to see her)  Activity Tolerance: Patient limited by pain;Other (comment) (dizzy) Patient left: in chair;with call bell/phone within reach;with family/visitor present  OT Visit Diagnosis: Pain Pain - Right/Left: Right Pain - part of body: Knee                Time:  [id number] OT Time Calculation (min): 17 min Charges:  OT General Charges $OT Visit: 1 Procedure OT Evaluation $OT Eval Moderate Complexity: 1 Procedure G-Codes:      Earlie RavelingLindsey L Trey Gulbranson OTR/L 08/31/2016, 8:54 AM

## 2016-08-31 NOTE — Progress Notes (Signed)
Physical Therapy Treatment Patient Details Name: Oscar Melendez MRN: [id number] DOB: 1950/12/23 Today's Date: 08/31/2016    History of Present Illness Pt is s/p elective R TKA secondary to R knee OA. PMH includes CAD, AFIB, HTN, RCA occlusion, CVA, and obesity.     PT Comments     Patient is making progress although he is still limited by pain. When he fatigues his leg begins to give. He was advised to be aware of this at home. His range was measured at 6-60 degrees. He was advised to keep working on his flexion. Therapy educated him on proper technique to improve range but not inflammation.    Follow Up Recommendations  DC plan and follow up therapy as arranged by surgeon;Supervision/Assistance - 24 hour     Equipment Recommendations  None recommended by PT    Recommendations for Other Services       Precautions / Restrictions Precautions Precautions: Knee Precaution Booklet Issued: No Precaution Comments: educated on knee precautions Required Braces or Orthoses: Knee Immobilizer - Right Knee Immobilizer - Right: Other (comment) Restrictions Weight Bearing Restrictions: Yes RLE Weight Bearing: Weight bearing as tolerated    Mobility  Bed Mobility Overal bed mobility: Needs Assistance Bed Mobility: Sit to Supine     Supine to sit: Min assist     General bed mobility comments: Patient required min assistance to get leg back into bed.  Transfers Overall transfer level: Needs assistance Equipment used: Rolling walker (2 wheeled) Transfers: Sit to/from Stand Sit to Stand: Min assist;From elevated surface         General transfer comment: cued for hand placement.  Ambulation/Gait Ambulation/Gait assistance: Min guard;Min assist Ambulation Distance (Feet): 30 Feet Assistive device: Rolling walker (2 wheeled) Gait Pattern/deviations: Step-to pattern;Decreased step length - right;Decreased weight shift to right;Antalgic;Trunk flexed Gait velocity: Decreased    General Gait Details: As patient fatigued his leg began to buckle and he had some increased pain    Stairs            Wheelchair Mobility    Modified Rankin (Stroke Patients Only)       Balance Overall balance assessment: Needs assistance Sitting-balance support: No upper extremity supported;Feet supported Sitting balance-Leahy Scale: Good     Standing balance support: Bilateral upper extremity supported;During functional activity Standing balance-Leahy Scale: Poor Standing balance comment: Reliant on RW for stability.                             Cognition Arousal/Alertness: Awake/alert Behavior During Therapy: WFL for tasks assessed/performed;Flat affect Overall Cognitive Status: Within Functional Limits for tasks assessed                                        Exercises Total Joint Exercises Ankle Circles/Pumps: AROM;Both;Supine;20 reps Quad Sets: 20 reps Gluteal Sets: 20 reps Heel Slides: 10 reps (used sheet for end range stretch )    General Comments General comments (skin integrity, edema, etc.): Wife present       Pertinent Vitals/Pain Pain Assessment: 0-10 Pain Score: 8  Pain Location: right knee Pain Descriptors / Indicators: Other (Comment) Pain Intervention(s): Limited activity within patient's tolerance;Monitored during session;Premedicated before session;Repositioned;Relaxation    Home Living Family/patient expects to be discharged to:: Private residence Living Arrangements: Spouse/significant other;Children Available Help at Discharge: Family;Available 24 hours/day Type of Home: House Home Access:  Ramped entrance   Home Layout: One level Home Equipment: Walker - 2 wheels;Other (comment);Bedside commode (CPM)      Prior Function Level of Independence: Independent          PT Goals (current goals can now be found in the care plan section) Acute Rehab PT Goals Patient Stated Goal: not stated PT Goal  Formulation: With patient/family Time For Goal Achievement: 09/06/16 Potential to Achieve Goals: Good    Frequency    7X/week      PT Plan Current plan remains appropriate    Co-evaluation              AM-PAC PT "6 Clicks" Daily Activity  Outcome Measure  Difficulty turning over in bed (including adjusting bedclothes, sheets and blankets)?: A Little Difficulty moving from lying on back to sitting on the side of the bed? : A Little Difficulty sitting down on and standing up from a chair with arms (e.g., wheelchair, bedside commode, etc,.)?: Total Help needed moving to and from a bed to chair (including a wheelchair)?: A Little Help needed walking in hospital room?: A Little Help needed climbing 3-5 steps with a railing? : A Lot 6 Click Score: 15    End of Session Equipment Utilized During Treatment: Gait belt;Right knee immobilizer Activity Tolerance: Patient limited by pain Patient left: in chair;with call bell/phone within reach;with family/visitor present Nurse Communication: Mobility status PT Visit Diagnosis: Other abnormalities of gait and mobility (R26.89);Pain Pain - Right/Left: Right Pain - part of body: Knee     Time: 1020-1055 PT Time Calculation (min) (ACUTE ONLY): 35 min  Charges:  $Gait Training: 8-22 mins $Therapeutic Exercise: 8-22 mins                    G Codes:         Dessie Coma PT DPT  08/31/2016, 11:59 AM

## 2016-08-31 NOTE — Progress Notes (Signed)
Orthopedic Tech Progress Note Patient Details:  Cecille AverJames D Hinde 09/27/1950 [id number]  CPM Right Knee CPM Right Knee: On Right Knee Flexion (Degrees): 90 Right Knee Extension (Degrees): 0 Additional Comments: applied at 1025   Saul FordyceJennifer C Jonathon Castelo 08/31/2016, 3:01 PM

## 2016-09-01 ENCOUNTER — Encounter (HOSPITAL_COMMUNITY): Payer: Self-pay | Admitting: Orthopedic Surgery

## 2016-09-01 DIAGNOSIS — E785 Hyperlipidemia, unspecified: Secondary | ICD-10-CM | POA: Diagnosis present

## 2016-09-01 DIAGNOSIS — I10 Essential (primary) hypertension: Secondary | ICD-10-CM | POA: Diagnosis present

## 2016-09-01 LAB — RENAL FUNCTION PANEL
ALBUMIN: 2.7 g/dL — AB (ref 3.5–5.0)
ANION GAP: 7 (ref 5–15)
BUN: 13 mg/dL (ref 6–20)
CO2: 28 mmol/L (ref 22–32)
Calcium: 8.3 mg/dL — ABNORMAL LOW (ref 8.9–10.3)
Chloride: 98 mmol/L — ABNORMAL LOW (ref 101–111)
Creatinine, Ser: 1.19 mg/dL (ref 0.61–1.24)
GFR calc non Af Amer: >60 mL/min (ref >60)
GLUCOSE: 136 mg/dL — AB (ref 65–99)
Phosphorus: 2 mg/dL — ABNORMAL LOW (ref 2.5–4.6)
Potassium: 3.3 mmol/L — ABNORMAL LOW (ref 3.5–5.1)
Sodium: 133 mmol/L — ABNORMAL LOW (ref 135–145)

## 2016-09-01 LAB — CBC
HCT: 41.3 % (ref 39.0–52.0)
HEMOGLOBIN: 13 g/dL (ref 13.0–17.0)
MCH: 29.1 pg (ref 26.0–34.0)
MCHC: 31.5 g/dL (ref 30.0–36.0)
MCV: 92.6 fL (ref 78.0–100.0)
Platelets: 152 10*3/uL (ref 150–400)
RBC: 4.46 MIL/uL (ref 4.22–5.81)
RDW: 14.7 % (ref 11.5–15.5)
WBC: 8 10*3/uL (ref 4.0–10.5)

## 2016-09-01 NOTE — Discharge Summary (Signed)
Orthopaedic Trauma Service (OTS)  Patient ID: Oscar Melendez MRN: [id number] DOB/AGE: October 15, 1950 66 y.o.  Admit date: 08/30/2016 Discharge date: 09/01/2016  Admission Diagnoses: Endstage DJD R knee CAD HTN Hyperlipidemia   Discharge Diagnoses:  Principal Problem:   Primary localized osteoarthritis of right knee Active Problems:   CAD (coronary artery disease)   Hypertension   Hyperlipemia   Procedures Performed: 08/30/2016- Dr. French Ana   Right total knee replacement.  Sigma cemented knee, size 5 femur, tibia with 41 mm all poly patella with 10 mm bearing.  Discharged Condition: good  Hospital Course:   White male admitted on 08/30/2016 for right total knee arthroplasty for endstage DJD of his right knee. Patient has a procedure noted above performed. After surgery he was transferred to the orthopedic floor for observation, pain control and to begin therapies. Patient was covered with perioperative antibiotics. He was started on apixaban were DVT prophylaxis. Patient worked with therapies of very diligently starting on postoperative day #1. He was placed in a CPM on postoperative day 0 and this was continued throughout his hospital stay. Patient's hospital stay was really unremarkable. His blood pressure medications were held as his blood pressures remained in acceptable range in addition to some mild increases in his serum creatinine. This did correct itself on postoperative day #2 with holding his ACE inhibitor as well as gentle hydration. Patient was doing very well on postoperative day #2. Tolerating a regular diet, mobilizing very well with therapies. Pain was well-controlled with oral pain medications. Patient was deemed be stable for discharge on postoperative day #2. Patient discharged in stable condition. All necessary DME had been delivered to his house prior to his admission. Informed prior to his discharge. Patient was placed into a Mepilex and TED hose.  Consults:  None  Significant Diagnostic Studies: labs:   Results for AVEER, BARTOW (MRN [id number]) as of 09/01/2016 10:40  Ref. Range 09/01/2016 04:26  Sodium Latest Ref Range: 135 - 145 mmol/L 133 (L)  Potassium Latest Ref Range: 3.5 - 5.1 mmol/L 3.3 (L)  Chloride Latest Ref Range: 101 - 111 mmol/L 98 (L)  CO2 Latest Ref Range: 22 - 32 mmol/L 28  Glucose Latest Ref Range: 65 - 99 mg/dL 136 (H)  BUN Latest Ref Range: 6 - 20 mg/dL 13  Creatinine Latest Ref Range: 0.61 - 1.24 mg/dL 1.19  Calcium Latest Ref Range: 8.9 - 10.3 mg/dL 8.3 (L)  Anion gap Latest Ref Range: 5 - 15  7  Phosphorus Latest Ref Range: 2.5 - 4.6 mg/dL 2.0 (L)  Albumin Latest Ref Range: 3.5 - 5.0 g/dL 2.7 (L)  EGFR (African American) Latest Ref Range: >60 mL/min >60  EGFR (Non-African Amer.) Latest Ref Range: >60 mL/min >60  WBC Latest Ref Range: 4.0 - 10.5 K/uL 8.0  RBC Latest Ref Range: 4.22 - 5.81 MIL/uL 4.46  Hemoglobin Latest Ref Range: 13.0 - 17.0 g/dL 13.0  HCT Latest Ref Range: 39.0 - 52.0 % 41.3  MCV Latest Ref Range: 78.0 - 100.0 fL 92.6  MCH Latest Ref Range: 26.0 - 34.0 pg 29.1  MCHC Latest Ref Range: 30.0 - 36.0 g/dL 31.5  RDW Latest Ref Range: 11.5 - 15.5 % 14.7  Platelets Latest Ref Range: 150 - 400 K/uL 152    Treatments: IV hydration, antibiotics: Ancef, analgesia: dilaudid, norco, ultram, anticoagulation: Apixaban, therapies: PT, OT and RN and surgery: as above   Discharge Exam:      Orthopedic Trauma Service Progress Note weekend coverage  Subjective:   Sitting up in chair Doing well Watched pt ambulate in hall with PT, he did well   No other concerns Wants to go home today  Appetite is good + voiding without difficulty + flatus      Review of Systems  Constitutional: Negative for chills and fever.  Respiratory: Negative for shortness of breath and wheezing.   Cardiovascular: Negative for chest pain and palpitations.  Gastrointestinal: Negative for abdominal pain, nausea and  vomiting.  Neurological: Negative for tingling and sensory change.      Objective:    VITALS:         Vitals:    08/31/16 0504 08/31/16 1601 08/31/16 1939 09/01/16 0408  BP: 105/66 135/68 126/73 121/61  Pulse: 89 69 67 80  Resp:   16      Temp: 98.2 F (36.8 C) 99 F (37.2 C) 99.2 F (37.3 C) 99.3 F (37.4 C)  TempSrc: Oral Oral Oral Oral  SpO2: 96% 99% 99% 94%      Intake/Output      06/16 0701 - 06/17 0700 06/17 0701 - 06/18 0700   P.O. 720    I.V. 872.5    IV Piggyback     Total Intake 1592.5     Urine 1350    Blood     Total Output 1350     Net +242.5             LABS   Lab Results Last 24 Hours       Results for orders placed or performed during the hospital encounter of 08/30/16 (from the past 24 hour(s))  CBC     Status: None    Collection Time: 09/01/16  4:26 AM  Result Value Ref Range    WBC 8.0 4.0 - 10.5 K/uL    RBC 4.46 4.22 - 5.81 MIL/uL    Hemoglobin 13.0 13.0 - 17.0 g/dL    HCT 41.3 39.0 - 52.0 %    MCV 92.6 78.0 - 100.0 fL    MCH 29.1 26.0 - 34.0 pg    MCHC 31.5 30.0 - 36.0 g/dL    RDW 14.7 11.5 - 15.5 %    Platelets 152 150 - 400 K/uL  Renal function panel     Status: Abnormal    Collection Time: 09/01/16  4:26 AM  Result Value Ref Range    Sodium 133 (L) 135 - 145 mmol/L    Potassium 3.3 (L) 3.5 - 5.1 mmol/L    Chloride 98 (L) 101 - 111 mmol/L    CO2 28 22 - 32 mmol/L    Glucose, Bld 136 (H) 65 - 99 mg/dL    BUN 13 6 - 20 mg/dL    Creatinine, Ser 1.19 0.61 - 1.24 mg/dL    Calcium 8.3 (L) 8.9 - 10.3 mg/dL    Phosphorus 2.0 (L) 2.5 - 4.6 mg/dL    Albumin 2.7 (L) 3.5 - 5.0 g/dL    GFR calc non Af Amer >60 >60 mL/min    GFR calc Af Amer >60 >60 mL/min    Anion gap 7 5 - 15          PHYSICAL EXAM:  Gen: awake and alert, sitting up in chair, appears well Lungs: breathing is unlabored Cardiac: RRR  Ext:       Right lower extremity              Dressing changed  Incision looks great, no drainage, no signs of infection               Ext warm             + DP pulse             No DCT              Compartments soft             EHL, FHL, AT, PT, peroneals, gastroc motor intact             DPN, SPN, TN sensation intact    Assessment/Plan: 2 Days Post-Op    Principal Problem:   Primary localized osteoarthritis of right knee Active Problems:   CAD (coronary artery disease)   Hypertension   Hyperlipemia               Anti-infectives     Start     Dose/Rate Route Frequency Ordered Stop    08/30/16 1400   ceFAZolin (ANCEF) IVPB 1 g/50 mL premix     1 g 100 mL/hr over 30 Minutes Intravenous Every 6 hours 08/30/16 1153 08/30/16 2046    08/30/16 0700   ceFAZolin (ANCEF) 3 g in dextrose 5 % 50 mL IVPB     3 g 130 mL/hr over 30 Minutes Intravenous To ShortStay Surgical 08/29/16 0929 08/30/16 1204     .   POD/HD#: 2   66 y/o male with endstage DJD R knee s/p R TKA   -endstage DJD R knee s/p R TKA             WBAT             PT/OT             CPM 6 hrs/day, can divide up into smaller sessions             Bone foam when at rest              Ice and elevate               Dressing changed today             Will have TED placed before DC             Ok to shower, clean wound with soap and water      - Pain management:             Continue with current regimen    - ABL anemia/Hemodynamics             Stable   - Medical issues              home meds     - DVT/PE prophylaxis:             Apixaban   - ID:              periop abx completed   - Activity:             Continue therapies   - FEN/GI prophylaxis/Foley/Lines:             Reg diet             Continue IVF   - Dispo:             dc home today              Follow up with Dr. Madelon Lips in 10-14 days  Disposition:   Discharge Instructions    Care order/instruction    Complete by:  As directed    INSTRUCTIONS AFTER JOINT REPLACEMENT   Remove items at home which could result in a fall. This includes throw rugs or  furniture in walking pathways ICE to the affected joint every three hours while awake for 30 minutes at a time, for at least the first 3-5 days, and then as needed for pain and swelling.  Continue to use ice for pain and swelling. You may notice swelling that will progress down to the foot and ankle.  This is normal after surgery.  Elevate your leg when you are not up walking on it.   Continue to use the breathing machine you got in the hospital (incentive spirometer) which will help keep your temperature down.  It is common for your temperature to cycle up and down following surgery, especially at night when you are not up moving around and exerting yourself.  The breathing machine keeps your lungs expanded and your temperature down.   DIET:  As you were doing prior to hospitalization, we recommend a well-balanced diet.  DRESSING / WOUND CARE / SHOWERING  You may change your dressing 3-5 days after surgery.  Then change the dressing every day with sterile gauze.  Please use good hand washing techniques before changing the dressing.  Do not use any lotions or creams on the incision until instructed by your surgeon.  ACTIVITY  Increase activity slowly as tolerated, but follow the weight bearing instructions below.   No driving for 6 weeks or until further direction given by your physician.  You cannot drive while taking narcotics.  No lifting or carrying greater than 10 lbs. until further directed by your surgeon. Avoid periods of inactivity such as sitting longer than an hour when not asleep. This helps prevent blood clots.  You may return to work once you are authorized by your doctor.     WEIGHT BEARING   Weight bearing as tolerated with assist device (walker, cane, etc) as directed, use it as long as suggested by your surgeon or therapist, typically at least 4-6 weeks.   EXERCISES  Results after joint replacement surgery are often greatly improved when you follow the exercise, range of  motion and muscle strengthening exercises prescribed by your doctor. Safety measures are also important to protect the joint from further injury. Any time any of these exercises cause you to have increased pain or swelling, decrease what you are doing until you are comfortable again and then slowly increase them. If you have problems or questions, call your caregiver or physical therapist for advice.   Rehabilitation is important following a joint replacement. After just a few days of immobilization, the muscles of the leg can become weakened and shrink (atrophy).  These exercises are designed to build up the tone and strength of the thigh and leg muscles and to improve motion. Often times heat used for twenty to thirty minutes before working out will loosen up your tissues and help with improving the range of motion but do not use heat for the first two weeks following surgery (sometimes heat can increase post-operative swelling).   These exercises can be done on a training (exercise) mat, on the floor, on a table or on a bed. Use whatever works the best and is most comfortable for you.    Use music or television while you are exercising so that the exercises are a pleasant break in your  day. This will make your life better with the exercises acting as a break in your routine that you can look forward to.   Perform all exercises about fifteen times, three times per day or as directed.  You should exercise both the operative leg and the other leg as well.  Exercises include:   Quad Sets - Tighten up the muscle on the front of the thigh (Quad) and hold for 5-10 seconds.   Straight Leg Raises - With your knee straight (if you were given a brace, keep it on), lift the leg to 60 degrees, hold for 3 seconds, and slowly lower the leg.  Perform this exercise against resistance later as your leg gets stronger.  Leg Slides: Lying on your back, slowly slide your foot toward your buttocks, bending your knee up off  the floor (only go as far as is comfortable). Then slowly slide your foot back down until your leg is flat on the floor again.  Angel Wings: Lying on your back spread your legs to the side as far apart as you can without causing discomfort.  Hamstring Strength:  Lying on your back, push your heel against the floor with your leg straight by tightening up the muscles of your buttocks.  Repeat, but this time bend your knee to a comfortable angle, and push your heel against the floor.  You may put a pillow under the heel to make it more comfortable if necessary.   A rehabilitation program following joint replacement surgery can speed recovery and prevent re-injury in the future due to weakened muscles. Contact your doctor or a physical therapist for more information on knee rehabilitation.    CONSTIPATION  Constipation is defined medically as fewer than three stools per week and severe constipation as less than one stool per week.  Even if you have a regular bowel pattern at home, your normal regimen is likely to be disrupted due to multiple reasons following surgery.  Combination of anesthesia, postoperative narcotics, change in appetite and fluid intake all can affect your bowels.   YOU MUST use at least one of the following options; they are listed in order of increasing strength to get the job done.  They are all available over the counter, and you may need to use some, POSSIBLY even all of these options:    Drink plenty of fluids (prune juice may be helpful) and high fiber foods Colace 100 mg by mouth twice a day  Senokot for constipation as directed and as needed Dulcolax (bisacodyl), take with full glass of water  Miralax (polyethylene glycol) once or twice a day as needed.  If you have tried all these things and are unable to have a bowel movement in the first 3-4 days after surgery call either your surgeon or your primary doctor.    If you experience loose stools or diarrhea, hold the  medications until you stool forms back up.  If your symptoms do not get better within 1 week or if they get worse, check with your doctor.  If you experience "the worst abdominal pain ever" or develop nausea or vomiting, please contact the office immediately for further recommendations for treatment.   ITCHING:  If you experience itching with your medications, try taking only a single pain pill, or even half a pain pill at a time.  You can also use Benadryl over the counter for itching or also to help with sleep.   TED HOSE STOCKINGS:  Use stockings on both  legs until for at least 2 weeks or as directed by physician office. They may be removed at night for sleeping.  MEDICATIONS:  See your medication summary on the "After Visit Summary" that nursing will review with you.  You may have some home medications which will be placed on hold until you complete the course of blood thinner medication.  It is important for you to complete the blood thinner medication as prescribed.  PRECAUTIONS:  If you experience chest pain or shortness of breath - call 911 immediately for transfer to the hospital emergency department.   If you develop a fever greater that 101 F, purulent drainage from wound, increased redness or drainage from wound, foul odor from the wound/dressing, or calf pain - CONTACT YOUR SURGEON.                                                   FOLLOW-UP APPOINTMENTS:  If you do not already have a post-op appointment, please call the office for an appointment to be seen by your surgeon.  Guidelines for how soon to be seen are listed in your "After Visit Summary", but are typically between 1-4 weeks after surgery.  OTHER INSTRUCTIONS:   Knee Replacement:  Do not place pillow under knee, focus on keeping the knee straight while resting. CPM instructions: 0-90 degrees, 2 hours in the morning, 2 hours in the afternoon, and 2 hours in the evening. Place foam block, curve side up under heel at all times  except when in CPM or when walking.  DO NOT modify, tear, cut, or change the foam block in any way.  MAKE SURE YOU:  Understand these instructions.  Get help right away if you are not doing well or get worse.    Thank you for letting us be a part of your medical care team.  It is a privilege we respect greatly.  We hope these instructions will help you stay on track for a fast and full recovery!     Allergies as of 09/01/2016      Reactions   Dye Fdc Red [red Dye] Other (See Comments)   BURNING SENSATION      Medication List    STOP taking these medications   acetaminophen 325 MG tablet Commonly known as:  TYLENOL     TAKE these medications   apixaban 2.5 MG Tabs tablet Commonly known as:  ELIQUIS Take 1 tablet (2.5 mg total) by mouth 2 (two) times daily.   aspirin EC 81 MG tablet Take 81 mg by mouth daily.   b complex vitamins capsule Take 1 capsule by mouth daily.   CAL-CITRATE PO Take 1 tablet by mouth daily.   HYDROcodone-acetaminophen 5-325 MG tablet Commonly known as:  NORCO Take 1-2 tablets by mouth every 4 (four) hours as needed for moderate pain.   lisinopril-hydrochlorothiazide 20-25 MG tablet Commonly known as:  PRINZIDE,ZESTORETIC Take 1 tablet by mouth daily.   Magnesium 100 MG Tabs Take 1 tablet by mouth daily.   metoprolol tartrate 50 MG tablet Commonly known as:  LOPRESSOR Take 25 mg by mouth every morning.   Potassium 99 MG Tabs Take 1 tablet by mouth daily.   TGT PSYLLIUM FIBER PO Take by mouth.   traMADol 50 MG tablet Commonly known as:  ULTRAM Take 2 tablets (100 mg total) by mouth every  6 (six) hours as needed for moderate pain. What changed:  how much to take   Vitamin D (Cholecalciferol) 1000 units Caps Take 1 capsule by mouth daily.            Durable Medical Equipment        Start     Ordered   08/30/16 1153  DME 3 n 1  Once     08/30/16 1153     Follow-up Information    Earlie Server, MD. Schedule an  appointment as soon as possible for a visit in 2 week(s).   Specialty:  Orthopedic Surgery Contact information: [address] [place] [address] [phone]        [facility] Follow up.   Specialty:  [place] Why:  A representative from [facility] will contact you to arrange start date and time for your therapy. Contact information: [address] [place] [address] [phone]            Signed:  Jari Pigg, PA-C [facility] [phone] (P) 09/01/2016, 10:44 AM

## 2016-09-01 NOTE — Progress Notes (Signed)
Physical Therapy Treatment Patient Details Name: Oscar Melendez MRN: [id number] DOB: Dec 12, 1950 Today's Date: 09/01/2016    History of Present Illness Pt is s/p elective R TKA secondary to R knee OA. PMH includes CAD, AFIB, HTN, RCA occlusion, CVA, and obesity.     PT Comments    Continuing work on functional mobility and activity tolerance, with improvements in activity tolerance, especially gait distance; on track for dc home today; needs reinforcement on TKA precautions  Follow Up Recommendations  DC plan and follow up therapy as arranged by surgeon;Supervision/Assistance - 24 hour     Equipment Recommendations  None recommended by PT    Recommendations for Other Services       Precautions / Restrictions Precautions Precautions: Knee Precaution Comments: Pt educated to not allow any pillow or bolster under knee for healing with optimal range of motion.  Restrictions Weight Bearing Restrictions: Yes RLE Weight Bearing: Weight bearing as tolerated    Mobility  Bed Mobility Overal bed mobility: Needs Assistance Bed Mobility: Supine to Sit     Supine to sit: Min guard     General bed mobility comments: Requesting assistance to help RLE off of bed, but ultimately able to complete task without physical assist; cues for technqiue  Transfers Overall transfer level: Needs assistance Equipment used: Rolling walker (2 wheeled) Transfers: Sit to/from Stand Sit to Stand: Min guard         General transfer comment: Cues for hand placement and safety  Ambulation/Gait Ambulation/Gait assistance: Min guard Ambulation Distance (Feet): 90 Feet Assistive device: Rolling walker (2 wheeled) Gait Pattern/deviations: Step-to pattern;Decreased step length - right;Decreased weight shift to right;Antalgic;Trunk flexed Gait velocity: Decreased   General Gait Details: improved stability with gait and improved gait speed. Cues to activate R quad for stance stability   Stairs             Wheelchair Mobility    Modified Rankin (Stroke Patients Only)       Balance     Sitting balance-Leahy Scale: Good       Standing balance-Leahy Scale: Poor                              Cognition Arousal/Alertness: Awake/alert Behavior During Therapy: WFL for tasks assessed/performed;Flat affect Overall Cognitive Status: Within Functional Limits for tasks assessed                                        Exercises      General Comments        Pertinent Vitals/Pain Pain Assessment: 0-10 Pain Score: 3  Pain Location: right knee Pain Descriptors / Indicators: Aching Pain Intervention(s): Premedicated before session    Home Living                      Prior Function            PT Goals (current goals can now be found in the care plan section) Acute Rehab PT Goals Patient Stated Goal: back to running (eventually -- tells me heplans to replace other knee as well) PT Goal Formulation: With patient/family Time For Goal Achievement: 09/06/16 Potential to Achieve Goals: Good Progress towards PT goals: Progressing toward goals    Frequency    7X/week      PT Plan Current plan remains appropriate  Co-evaluation              AM-PAC PT "6 Clicks" Daily Activity  Outcome Measure  Difficulty turning over in bed (including adjusting bedclothes, sheets and blankets)?: A Little Difficulty moving from lying on back to sitting on the side of the bed? : A Little Difficulty sitting down on and standing up from a chair with arms (e.g., wheelchair, bedside commode, etc,.)?: A Lot Help needed moving to and from a bed to chair (including a wheelchair)?: A Little Help needed walking in hospital room?: None Help needed climbing 3-5 steps with a railing? : A Little 6 Click Score: 18    End of Session Equipment Utilized During Treatment: Gait belt Activity Tolerance: Patient tolerated treatment well Patient left:  in chair;with call bell/phone within reach Nurse Communication: Mobility status PT Visit Diagnosis: Other abnormalities of gait and mobility (R26.89);Pain Pain - Right/Left: Right Pain - part of body: Knee     Time: 0901-0927 PT Time Calculation (min) (ACUTE ONLY): 26 min  Charges:  $Gait Training: 23-37 mins                    G Codes:       Van ClinesHolly Keierra Nudo, PT  Acute Rehabilitation Services Pager [phone] Office [phone]    Levi AlandHolly H Saffron Busey 09/01/2016, 9:53 AM

## 2016-09-01 NOTE — Progress Notes (Signed)
Orthopedic Trauma Service Progress Note weekend coverage    Subjective:  Sitting up in chair Doing well Watched pt ambulate in hall with PT, he did well  No other concerns Wants to go home today  Appetite is good + voiding without difficulty + flatus    Review of Systems  Constitutional: Negative for chills and fever.  Respiratory: Negative for shortness of breath and wheezing.   Cardiovascular: Negative for chest pain and palpitations.  Gastrointestinal: Negative for abdominal pain, nausea and vomiting.  Neurological: Negative for tingling and sensory change.    Objective:   VITALS:   Vitals:   08/31/16 0504 08/31/16 1601 08/31/16 1939 09/01/16 0408  BP: 105/66 135/68 126/73 121/61  Pulse: 89 69 67 80  Resp:  16    Temp: 98.2 F (36.8 C) 99 F (37.2 C) 99.2 F (37.3 C) 99.3 F (37.4 C)  TempSrc: Oral Oral Oral Oral  SpO2: 96% 99% 99% 94%    Intake/Output      06/16 0701 - 06/17 0700 06/17 0701 - 06/18 0700   P.O. 720    I.V. 872.5    IV Piggyback     Total Intake 1592.5     Urine 1350    Blood     Total Output 1350     Net +242.5            LABS  Results for orders placed or performed during the hospital encounter of 08/30/16 (from the past 24 hour(s))  CBC     Status: None   Collection Time: 09/01/16  4:26 AM  Result Value Ref Range   WBC 8.0 4.0 - 10.5 K/uL   RBC 4.46 4.22 - 5.81 MIL/uL   Hemoglobin 13.0 13.0 - 17.0 g/dL   HCT 16.1 09.6 - 04.5 %   MCV 92.6 78.0 - 100.0 fL   MCH 29.1 26.0 - 34.0 pg   MCHC 31.5 30.0 - 36.0 g/dL   RDW 40.9 81.1 - 91.4 %   Platelets 152 150 - 400 K/uL  Renal function panel     Status: Abnormal   Collection Time: 09/01/16  4:26 AM  Result Value Ref Range   Sodium 133 (L) 135 - 145 mmol/L   Potassium 3.3 (L) 3.5 - 5.1 mmol/L   Chloride 98 (L) 101 - 111 mmol/L   CO2 28 22 - 32 mmol/L   Glucose, Bld 136 (H) 65 - 99 mg/dL   BUN 13 6 - 20 mg/dL   Creatinine, Ser 7.82 0.61 - 1.24 mg/dL   Calcium 8.3 (L) 8.9 - 10.3 mg/dL   Phosphorus 2.0 (L) 2.5 - 4.6 mg/dL   Albumin 2.7 (L) 3.5 - 5.0 g/dL   GFR calc non Af Amer >60 >60 mL/min   GFR calc Af Amer >60 >60 mL/min   Anion gap 7 5 - 15     PHYSICAL EXAM:  Gen: awake and alert, sitting up in chair, appears well Lungs: breathing is unlabored Cardiac: RRR  Ext:       Right lower extremity              Dressing changed  Incision looks great, no drainage, no signs of infection              Ext warm             + DP pulse             No DCT  Compartments soft             EHL, FHL, AT, PT, peroneals, gastroc motor intact             DPN, SPN, TN sensation intact   Assessment/Plan: 2 Days Post-Op   Principal Problem:   Primary localized osteoarthritis of right knee Active Problems:   CAD (coronary artery disease)   Hypertension   Hyperlipemia   Anti-infectives    Start     Dose/Rate Route Frequency Ordered Stop   08/30/16 1400  ceFAZolin (ANCEF) IVPB 1 g/50 mL premix     1 g 100 mL/hr over 30 Minutes Intravenous Every 6 hours 08/30/16 1153 08/30/16 2046   08/30/16 0700  ceFAZolin (ANCEF) 3 g in dextrose 5 % 50 mL IVPB     3 g 130 mL/hr over 30 Minutes Intravenous To ShortStay Surgical 08/29/16 0929 08/30/16 1204    .  POD/HD#: 2  66 y/o male with endstage DJD R knee s/p R TKA  -endstage DJD R knee s/p R TKA             WBAT             PT/OT             CPM 6 hrs/day, can divide up into smaller sessions             Bone foam when at rest              Ice and elevate   Dressing changed today  Will have TED placed before DC  Ok to shower, clean wound with soap and water                 - Pain management:             Continue with current regimen    - ABL anemia/Hemodynamics             Stable   - Medical issues              home meds     - DVT/PE prophylaxis:             Apixaban   - ID:              periop abx completed   - Activity:             Continue therapies   -  FEN/GI prophylaxis/Foley/Lines:             Reg diet             Continue IVF   - Dispo:             dc home today   Follow up with Dr. Madelon Lipsaffrey in 10-14 days      Mearl LatinKeith W. Amran Malter, PA-C [facility] [phone] (O) 09/01/2016, 10:13 AM

## 2016-09-01 NOTE — Progress Notes (Signed)
Occupational Therapy Treatment Patient Details Name: Oscar Melendez MRN: [id number] DOB: 06-19-1950 Today's Date: 09/01/2016    History of present illness Pt is s/p elective R TKA secondary to R knee OA. PMH includes CAD, AFIB, HTN, RCA occlusion, CVA, and obesity.    OT comments  Educated pt on walk in shower transfer technique, pt able to return demo with min guard assist. Pt required max assist to don R sock; plan for wife to assist with LB ADL as needed. D/c plan remains appropriate. Will continue to follow acutely.   Follow Up Recommendations  DC plan and follow up therapy as arranged by surgeon;Supervision/Assistance - 24 hour    Equipment Recommendations  None recommended by OT    Recommendations for Other Services      Precautions / Restrictions Precautions Precautions: Knee Precaution Booklet Issued: No Precaution Comments:  Required Braces or Orthoses: Knee Immobilizer - Right Restrictions Weight Bearing Restrictions: Yes RLE Weight Bearing: Weight bearing as tolerated       Mobility Bed Mobility Overal bed mobility: Needs Assistance Bed Mobility: Supine to Sit;Sit to Supine     Supine to sit: Min guard Sit to supine: Min assist   General bed mobility comments: Assist for RLE back to bed.  Transfers Overall transfer level: Needs assistance Equipment used: Rolling walker (2 wheeled) Transfers: Sit to/from Stand Sit to Stand: Min guard         General transfer comment: Cues for hand placement and safety    Balance Overall balance assessment: Needs assistance Sitting-balance support: No upper extremity supported;Feet supported Sitting balance-Leahy Scale: Good     Standing balance support: Bilateral upper extremity supported;During functional activity Standing balance-Leahy Scale: Poor                             ADL either performed or assessed with clinical judgement   ADL Overall ADL's : Needs assistance/impaired                      Lower Body Dressing: Maximal assistance Lower Body Dressing Details (indicate cue type and reason): to don R sock; wife to assist as needed upon return home         Tub/ Shower Transfer: Min guard;Walk-in shower;Ambulation;Shower Dealer Details (indicate cue type and reason): Educated pt on walk in shower transfer technique; pt able to return demo with min guard assist Functional mobility during ADLs: Nurse, children's      Cognition Arousal/Alertness: Awake/alert Behavior During Therapy: WFL for tasks assessed/performed;Flat affect Overall Cognitive Status: Within Functional Limits for tasks assessed                                          Exercises     Shoulder Instructions       General Comments      Pertinent Vitals/ Pain       Pain Assessment: Faces Pain Score: 3  Faces Pain Scale: Hurts little more Pain Location: R knee Pain Descriptors / Indicators: Aching;Sore Pain Intervention(s): Monitored during session  Home Living  Prior Functioning/Environment              Frequency  Min 2X/week        Progress Toward Goals  OT Goals(current goals can now be found in the care plan section)  Progress towards OT goals: Progressing toward goals  Acute Rehab OT Goals Patient Stated Goal: return home today OT Goal Formulation: With patient  Plan Discharge plan remains appropriate    Co-evaluation                 AM-PAC PT "6 Clicks" Daily Activity     Outcome Measure   Help from another person eating meals?: None Help from another person taking care of personal grooming?: A Little Help from another person toileting, which includes using toliet, bedpan, or urinal?: A Little Help from another person bathing (including washing, rinsing, drying)?: A Little Help from  another person to put on and taking off regular upper body clothing?: A Little Help from another person to put on and taking off regular lower body clothing?: A Lot 6 Click Score: 18    End of Session Equipment Utilized During Treatment: Rolling walker   OT Visit Diagnosis: Pain Pain - Right/Left: Right Pain - part of body: Knee   Activity Tolerance Patient tolerated treatment well   Patient Left in bed;with call bell/phone within reach   Nurse Communication          Time: 1051-1105 OT Time Calculation (min): 14 min  Charges: OT General Charges $OT Visit: 1 Procedure OT Treatments $Self Care/Home Management : 8-22 mins  Adrain Butrick A. Brett Albinooffey, M.S., OTR/L Pager: [phone]   Gaye AlkenBailey A Branch Pacitti 09/01/2016, 11:10 AM

## 2016-09-02 ENCOUNTER — Encounter (HOSPITAL_COMMUNITY): Payer: Self-pay | Admitting: Orthopedic Surgery

## 2016-11-26 ENCOUNTER — Ambulatory Visit: Payer: Self-pay | Admitting: Physician Assistant

## 2016-11-26 NOTE — H&P (Signed)
TOTAL KNEE ADMISSION H&P  Patient is being admitted for left total knee arthroplasty.  Subjective:  Chief Complaint:left knee pain.  HPI: Oscar Melendez, 66 y.o. male, has a history of pain and functional disability in the left knee due to arthritis and has failed non-surgical conservative treatments for greater than 12 weeks to includeNSAID's and/or analgesics, corticosteriod injections, viscosupplementation injections and activity modification.  Onset of symptoms was gradual, starting 8 years ago with gradually worsening course since that time. The patient noted no past surgery on the left knee(s).  Patient currently rates pain in the left knee(s) at 8 out of 10 with activity. Patient has night pain, worsening of pain with activity and weight bearing, pain that interferes with activities of daily living, pain with passive range of motion, crepitus and joint swelling.  Patient has evidence of periarticular osteophytes and joint space narrowing by imaging studies.  There is no active infection.  Patient Active Problem List   Diagnosis Date Noted  . Hypertension   . Hyperlipemia   . Primary localized osteoarthritis of right knee 08/30/2016  . CAD (coronary artery disease) 07/09/2016   Past Medical History:  Diagnosis Date  . Atherosclerotic heart disease of native coronary artery without angina pectoris   . Atrial fibrillation (HCC)   . CAD (coronary artery disease) 07/09/2016  . Chronic knee pain   . Dermatophytosis of nail   . H/O inguinal hernia repair   . History of kidney stones    has passed 3 stones  . Hyperlipemia   . Hypertension   . Impotence of organic origin   . Incomplete RBBB   . Morbid obesity (HCC)   . Onychomycosis   . Osteoarthrosis    knees  . PONV (postoperative nausea and vomiting)    nausea only  . RCA occlusion (HCC)   . Stroke [facility](HCC) 1998   lost speech, regained it in 2months  . Transient cerebral ischemia     Past Surgical History:  Procedure  Laterality Date  . HERNIA REPAIR    . INNER EAR SURGERY Left prior to 1990  . JOINT REPLACEMENT    . 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 TOTAL KNEE ARTHROPLASTY Right 08/30/2016   Procedure: RIGHT TOTAL KNEE ARTHROPLASTY;  Surgeon: Frederico Hammanaffrey, Daniel, MD;  [facility];  Service: Orthopedics;  Laterality: Right;     (Not in a hospital admission) Allergies  Allergen Reactions  . Dye Fdc Red [Red Dye] Other (See Comments)    BURNING SENSATION    Social History  Substance Use Topics  . Smoking status: Never Smoker  . Smokeless tobacco: Never Used  . Alcohol use No    Family History  Problem Relation Age of Onset  . Heart disease Mother   . Cancer Brother      Review of Systems  HENT: Positive for hearing loss.   Gastrointestinal: Positive for blood in stool and melena.  Genitourinary: Positive for hematuria.  Musculoskeletal: Positive for joint pain.  All other systems reviewed and are negative.   Objective:  Physical Exam  Constitutional: He is oriented to person, place, and time. He appears well-developed and well-nourished. No distress.  HENT:  Head: Normocephalic and atraumatic.  Nose: Nose normal.  Eyes: Pupils are equal, round, and reactive to light. Conjunctivae and EOM are normal.  Neck: Normal range of motion. Neck supple.  Cardiovascular: Normal rate, regular rhythm, normal heart sounds and intact distal pulses.   Respiratory: Effort normal and breath sounds normal. No respiratory  distress. He has no wheezes.  GI: Soft. Bowel sounds are normal. He exhibits no distension. There is no tenderness.  Musculoskeletal:       Left knee: He exhibits normal range of motion. Tenderness found.  Lymphadenopathy:    He has no cervical adenopathy.  Neurological: He is alert and oriented to person, place, and time. No cranial nerve deficit.  Skin: Skin is warm and dry. No rash noted. No erythema.  Psychiatric: He has a normal mood and affect. His behavior is normal.     Vital signs in last 24 hours: @  Labs:   Estimated body mass index is 36.18 kg/m as calculated from the following:   Height as of 08/19/16: 6' (1.829 m).   Weight as of 08/19/16: 121 kg (266 lb 12.8 oz).   Imaging Review Plain radiographs demonstrate moderate degenerative joint disease of the left knee(s). The overall alignment ismild varus. The bone quality appears to be good for age and reported activity level.  Assessment/Plan:  End stage arthritis,left knee   The patient history, physical examination, clinical judgment of the provider and imaging studies are consistent with end stage degenerative joint disease of the left knee(s) and total knee arthroplasty is deemed medically necessary. The treatment options including medical management, injection therapy arthroscopy and arthroplasty were discussed at length. The risks and benefits of total knee arthroplasty were presented and reviewed. The risks due to aseptic loosening, infection, stiffness, patella tracking problems, thromboembolic complications and other imponderables were discussed. The patient acknowledged the explanation, agreed to proceed with the plan and consent was signed. Patient is being admitted for inpatient treatment for surgery, pain control, PT, OT, prophylactic antibiotics, VTE prophylaxis, progressive ambulation and ADL's and discharge planning. The patient is planning to be discharged home with home health services

## 2016-12-09 ENCOUNTER — Encounter (HOSPITAL_COMMUNITY)
Admission: RE | Admit: 2016-12-09 | Discharge: 2016-12-09 | Disposition: A | Discharge status: Home / Self Care | Payer: Medicare HMO | Source: Ambulatory Visit | Attending: Orthopedic Surgery | Admitting: Orthopedic Surgery

## 2016-12-09 ENCOUNTER — Encounter (HOSPITAL_COMMUNITY): Payer: Self-pay

## 2016-12-09 DIAGNOSIS — Z01812 Encounter for preprocedural laboratory examination: Secondary | ICD-10-CM | POA: Diagnosis present

## 2016-12-09 DIAGNOSIS — M1712 Unilateral primary osteoarthritis, left knee: Secondary | ICD-10-CM | POA: Insufficient documentation

## 2016-12-09 LAB — TYPE AND SCREEN
ABO/RH(D): A POS
Antibody Screen: NEGATIVE

## 2016-12-09 LAB — CBC WITH DIFFERENTIAL/PLATELET
BASOS ABS: 0 10*3/uL (ref 0.0–0.1)
BASOS PCT: 1 %
EOS PCT: 2 %
Eosinophils Absolute: 0.1 10*3/uL (ref 0.0–0.7)
HCT: 48.2 % (ref 39.0–52.0)
Hemoglobin: 15.7 g/dL (ref 13.0–17.0)
Lymphocytes Relative: 26 %
Lymphs Abs: 1.6 10*3/uL (ref 0.7–4.0)
MCH: 29.1 pg (ref 26.0–34.0)
MCHC: 32.6 g/dL (ref 30.0–36.0)
MCV: 89.3 fL (ref 78.0–100.0)
MONO ABS: 0.5 10*3/uL (ref 0.1–1.0)
Monocytes Relative: 9 %
Neutro Abs: 3.7 10*3/uL (ref 1.7–7.7)
Neutrophils Relative %: 62 %
PLATELETS: 173 10*3/uL (ref 150–400)
RBC: 5.4 MIL/uL (ref 4.22–5.81)
RDW: 14.4 % (ref 11.5–15.5)
WBC: 5.9 10*3/uL (ref 4.0–10.5)

## 2016-12-09 LAB — COMPREHENSIVE METABOLIC PANEL
ALBUMIN: 3.8 g/dL (ref 3.5–5.0)
ALT: 31 U/L (ref 17–63)
AST: 35 U/L (ref 15–41)
Alkaline Phosphatase: 58 U/L (ref 38–126)
Anion gap: 8 (ref 5–15)
BUN: 12 mg/dL (ref 6–20)
CHLORIDE: 103 mmol/L (ref 101–111)
CO2: 26 mmol/L (ref 22–32)
CREATININE: 1.19 mg/dL (ref 0.61–1.24)
Calcium: 9.4 mg/dL (ref 8.9–10.3)
GFR calc Af Amer: >60 mL/min (ref >60)
GFR calc non Af Amer: >60 mL/min (ref >60)
GLUCOSE: 101 mg/dL — AB (ref 65–99)
POTASSIUM: 3.9 mmol/L (ref 3.5–5.1)
SODIUM: 137 mmol/L (ref 135–145)
Total Bilirubin: 1.1 mg/dL (ref 0.3–1.2)
Total Protein: 7 g/dL (ref 6.5–8.1)

## 2016-12-09 LAB — SURGICAL PCR SCREEN
MRSA, PCR: NEGATIVE
STAPHYLOCOCCUS AUREUS: NEGATIVE

## 2016-12-09 LAB — URINALYSIS, ROUTINE W REFLEX MICROSCOPIC
Bilirubin Urine: NEGATIVE
Glucose, UA: NEGATIVE mg/dL
HGB URINE DIPSTICK: NEGATIVE
Ketones, ur: NEGATIVE mg/dL
LEUKOCYTES UA: NEGATIVE
NITRITE: NEGATIVE
PROTEIN: NEGATIVE mg/dL
Specific Gravity, Urine: 1.014 (ref 1.005–1.030)
pH: 5 (ref 5.0–8.0)

## 2016-12-09 NOTE — Pre-Procedure Instructions (Signed)
    Oscar Melendez  12/09/2016      [facility] - [place], [place] - [address] [place] [address] Phone: [phone] Fax: [phone]  [facility] [address], [place] - [address] [place] [address] Phone: [phone] Fax: [phone]    Your procedure is scheduled on 12/20/16.  Report to [facility] Admitting at 530 A.M.  Call this number if you have problems the morning of surgery:  [phone]   Remember:  Do not eat food or drink liquids after midnight.  Take these medicines the morning of surgery with A SIP OF WATER ---hydorocodone,metoprolol,ultram   Do not wear jewelry, make-up or nail polish.  Do not wear lotions, powders, or perfumes, or deoderant.  Do not shave 48 hours prior to surgery.  Men may shave face and neck.  Do not bring valuables to the hospital.  [facility] is not responsible for any belongings or valuables.  Contacts, dentures or bridgework may not be worn into surgery.  Leave your suitcase in the car.  After surgery it may be brought to your room.  For patients admitted to the hospital, discharge time will be determined by your treatment team.  Patients discharged the day of surgery will not be allowed to drive home.   Name and phone number of your driver:    Special instructions:  Do not take any aspirin,anti-inflammatories,vitamins,or herbal supplements 5-7 days prior to surgery.  Please read over the following fact sheets that you were given. MRSA Information

## 2016-12-10 LAB — URINE CULTURE: Culture: NO GROWTH

## 2016-12-16 ENCOUNTER — Telehealth: Payer: Self-pay

## 2016-12-16 NOTE — Telephone Encounter (Signed)
No further cardiac testing needed before surgery.

## 2016-12-16 NOTE — Telephone Encounter (Signed)
   Chart reviewed as part of pre-operative protocol coverage. Patient with history of CAD with reported cath in 2009 with RCA CTO and atrial fib, no further records available. No prior EF assessment on file. No further details regarding atrial fib, but in NSR at last visit at which time he was doing well and was cleared from cardiac perspective for RIGHT total knee replacement which was uneventful. Last visit just under 6 months ago. Per protocol, contacted patient to review activity status index to assess for interim angina. Awaiting call back, so will leave message within pre-op pool until patient makes contact.   Will also forward to Dr. Eldridge Dace in the interim for review to provide input - if patient is asymptomatic, since you had cleared him without further evaluation at OV in 06/2016, do you feel he would be acceptable to proceed with upcoming total LEFT knee replacement? Vonna Kotyk, please forward back to CV DIV PREOP pool with answer. Thank you!  Josep Luviano PA-C

## 2016-12-16 NOTE — Telephone Encounter (Signed)
   Patient returned phone call, has been doing great without any recent anginal symptoms. He is walking briskly/jogging 3/4 mile per day without any CP. He has also been re-roofing his home. He does have some pain in his knees when he does activity but has been limited by any angina. Per [facility] Status Activity Index he is able to complete 7.25 METS. Will forward to Dr. Eldridge Dace for his input. Dayna Dunn PA-C

## 2016-12-16 NOTE — Telephone Encounter (Signed)
Request for surgical clearance:  1. What type of surgery is being performed? Left Total Knee Replacement   2. When is this surgery scheduled? 12/20/16   3. Are there any medications that need to be held prior to surgery and how long?   4. Name of physician performing surgery? Dr. Frederico Hamman   5. What is your office phone and fax number? Phone- [phone], Fax- [phone]

## 2016-12-17 NOTE — Telephone Encounter (Signed)
See below, pt cleared for surgery without further testing.  Corine Shelter PA-C 12/17/2016 1:44 PM

## 2016-12-19 MED ORDER — BUPIVACAINE LIPOSOME 1.3 % IJ SUSP
20.0000 mL | INTRAMUSCULAR | Status: AC
Start: 2016-12-20 — End: 2016-12-20
  Administered 2016-12-20: 20 mL
  Filled 2016-12-19: qty 20

## 2016-12-19 MED ORDER — TRANEXAMIC ACID 1000 MG/10ML IV SOLN
1000.0000 mg | INTRAVENOUS | Status: AC
  Administered 2016-12-20: 1000 mg via INTRAVENOUS
  Filled 2016-12-19: qty 1100

## 2016-12-19 MED ORDER — SODIUM CHLORIDE 0.9 % IV SOLN: INTRAVENOUS | Status: DC

## 2016-12-19 MED ORDER — DEXTROSE 5 % IV SOLN
3.0000 g | INTRAVENOUS | Status: AC
  Administered 2016-12-20: 3 g via INTRAVENOUS
  Filled 2016-12-19: qty 3000

## 2016-12-19 MED ORDER — TRANEXAMIC ACID 1000 MG/10ML IV SOLN
2000.0000 mg | INTRAVENOUS | Status: AC
  Administered 2016-12-20: 2000 mg via TOPICAL
  Filled 2016-12-19: qty 20

## 2016-12-19 NOTE — Anesthesia Preprocedure Evaluation (Addendum)
Anesthesia Evaluation  Patient identified by MRN, date of birth, ID band Patient awake    Reviewed: Allergy & Precautions, H&P , NPO status , Patient's Chart, lab work & pertinent test results, reviewed documented beta blocker date and time   History of Anesthesia Complications (+) PONV  Airway Mallampati: II  TM Distance: >3 FB Neck ROM: Full    Dental  (+) Teeth Intact, Dental Advisory Given   Pulmonary neg pulmonary ROS,    breath sounds clear to auscultation       Cardiovascular Exercise Tolerance: Good hypertension, Pt. on medications and Pt. on home beta blockers + CAD  + dysrhythmias Atrial Fibrillation  Rhythm:Regular Rate:Normal     Neuro/Psych CVA, No Residual Symptoms negative psych ROS   GI/Hepatic Neg liver ROS, PUD,   Endo/Other  Morbid obesity  Renal/GU negative Renal ROS  negative genitourinary   Musculoskeletal  (+) Arthritis , Osteoarthritis,    Abdominal (+) + obese,   Peds  Hematology negative hematology ROS (+)   Anesthesia Other Findings   Reproductive/Obstetrics negative OB ROS                            Anesthesia Physical  Anesthesia Plan  ASA: III  Anesthesia Plan: Spinal   Post-op Pain Management:  Regional for Post-op pain   Induction: Intravenous  PONV Risk Score and Plan: 3 and Ondansetron, Midazolam, Treatment may vary due to age or medical condition and Propofol infusion  Airway Management Planned: Simple Face Mask and Natural Airway  Additional Equipment: None  Intra-op Plan:   Post-operative Plan:   Informed Consent: I have reviewed the patients History and Physical, chart, labs and discussed the procedure including the risks, benefits and alternatives for the proposed anesthesia with the patient or authorized representative who has indicated his/her understanding and acceptance.   Dental advisory given  Plan Discussed with: CRNA,  Anesthesiologist and Surgeon  Anesthesia Plan Comments:        Anesthesia Quick Evaluation

## 2016-12-20 ENCOUNTER — Encounter (HOSPITAL_COMMUNITY)
Admission: RE | Disposition: A | Discharge status: Home / Self Care | Payer: Self-pay | Source: Ambulatory Visit | Attending: Orthopedic Surgery

## 2016-12-20 ENCOUNTER — Inpatient Hospital Stay (HOSPITAL_COMMUNITY): Payer: Medicare HMO | Admitting: Anesthesiology

## 2016-12-20 ENCOUNTER — Encounter (HOSPITAL_COMMUNITY): Payer: Self-pay | Admitting: Urology

## 2016-12-20 ENCOUNTER — Observation Stay (HOSPITAL_COMMUNITY)
Admission: RE | Admit: 2016-12-20 | Discharge: 2016-12-21 | Disposition: A | Discharge status: Home / Self Care | Payer: Medicare HMO | Source: Ambulatory Visit | Attending: Orthopedic Surgery | Admitting: Orthopedic Surgery

## 2016-12-20 DIAGNOSIS — Z8673 Personal history of transient ischemic attack (TIA), and cerebral infarction without residual deficits: Secondary | ICD-10-CM | POA: Diagnosis not present

## 2016-12-20 DIAGNOSIS — Z809 Family history of malignant neoplasm, unspecified: Secondary | ICD-10-CM | POA: Insufficient documentation

## 2016-12-20 DIAGNOSIS — I2510 Atherosclerotic heart disease of native coronary artery without angina pectoris: Secondary | ICD-10-CM | POA: Diagnosis not present

## 2016-12-20 DIAGNOSIS — Z96651 Presence of right artificial knee joint: Secondary | ICD-10-CM | POA: Insufficient documentation

## 2016-12-20 DIAGNOSIS — I10 Essential (primary) hypertension: Secondary | ICD-10-CM | POA: Diagnosis not present

## 2016-12-20 DIAGNOSIS — M21162 Varus deformity, not elsewhere classified, left knee: Secondary | ICD-10-CM | POA: Diagnosis not present

## 2016-12-20 DIAGNOSIS — Z7901 Long term (current) use of anticoagulants: Secondary | ICD-10-CM | POA: Diagnosis not present

## 2016-12-20 DIAGNOSIS — Z79899 Other long term (current) drug therapy: Secondary | ICD-10-CM | POA: Insufficient documentation

## 2016-12-20 DIAGNOSIS — M1712 Unilateral primary osteoarthritis, left knee: Secondary | ICD-10-CM | POA: Diagnosis not present

## 2016-12-20 DIAGNOSIS — Z7982 Long term (current) use of aspirin: Secondary | ICD-10-CM | POA: Insufficient documentation

## 2016-12-20 DIAGNOSIS — Z791 Long term (current) use of non-steroidal anti-inflammatories (NSAID): Secondary | ICD-10-CM | POA: Insufficient documentation

## 2016-12-20 DIAGNOSIS — Z6836 Body mass index (BMI) 36.0-36.9, adult: Secondary | ICD-10-CM | POA: Diagnosis not present

## 2016-12-20 DIAGNOSIS — E785 Hyperlipidemia, unspecified: Secondary | ICD-10-CM | POA: Diagnosis not present

## 2016-12-20 HISTORY — PX: TOTAL KNEE ARTHROPLASTY: SHX125

## 2016-12-20 SURGERY — ARTHROPLASTY, KNEE, TOTAL
Anesthesia: Regional | Site: Knee | Laterality: Left

## 2016-12-20 MED ORDER — HYDROMORPHONE HCL 1 MG/ML IJ SOLN: 1.0000 mg | INTRAMUSCULAR | Status: DC | PRN

## 2016-12-20 MED ORDER — HYDROCODONE-ACETAMINOPHEN 5-325 MG PO TABS
ORAL_TABLET | ORAL | Status: AC
  Filled 2016-12-20: qty 1

## 2016-12-20 MED ORDER — APIXABAN 2.5 MG PO TABS: 2.5000 mg | ORAL_TABLET | Freq: Two times a day (BID) | ORAL | 0 refills | Status: AC

## 2016-12-20 MED ORDER — LISINOPRIL 20 MG PO TABS
20.0000 mg | ORAL_TABLET | Freq: Every day | ORAL | Status: DC
  Administered 2016-12-21: 20 mg via ORAL
  Filled 2016-12-20: qty 1

## 2016-12-20 MED ORDER — APIXABAN 2.5 MG PO TABS
2.5000 mg | ORAL_TABLET | Freq: Two times a day (BID) | ORAL | Status: DC
  Administered 2016-12-21: 2.5 mg via ORAL
  Filled 2016-12-20: qty 1

## 2016-12-20 MED ORDER — CEFAZOLIN SODIUM-DEXTROSE 1-4 GM/50ML-% IV SOLN
1.0000 g | Freq: Four times a day (QID) | INTRAVENOUS | Status: AC
  Administered 2016-12-20: 1 g via INTRAVENOUS
  Filled 2016-12-20 (×2): qty 50

## 2016-12-20 MED ORDER — SODIUM CHLORIDE 0.9 % IV SOLN
INTRAVENOUS | Status: DC
  Administered 2016-12-20: 14:00:00 via INTRAVENOUS

## 2016-12-20 MED ORDER — FLEET ENEMA 7-19 GM/118ML RE ENEM: 1.0000 | ENEMA | Freq: Once | RECTAL | Status: DC | PRN

## 2016-12-20 MED ORDER — METOCLOPRAMIDE HCL 5 MG PO TABS: 5.0000 mg | ORAL_TABLET | Freq: Three times a day (TID) | ORAL | Status: DC | PRN

## 2016-12-20 MED ORDER — TRANEXAMIC ACID 1000 MG/10ML IV SOLN
1000.0000 mg | Freq: Once | INTRAVENOUS | Status: AC
  Administered 2016-12-20: 1000 mg via INTRAVENOUS
  Filled 2016-12-20: qty 10

## 2016-12-20 MED ORDER — ONDANSETRON HCL 4 MG PO TABS: 4.0000 mg | ORAL_TABLET | Freq: Four times a day (QID) | ORAL | Status: DC | PRN

## 2016-12-20 MED ORDER — MAGNESIUM 200 MG PO TABS
200.0000 mg | ORAL_TABLET | Freq: Every day | ORAL | Status: DC
  Administered 2016-12-21: 200 mg via ORAL
  Filled 2016-12-20 (×2): qty 1

## 2016-12-20 MED ORDER — FENTANYL CITRATE (PF) 250 MCG/5ML IJ SOLN
INTRAMUSCULAR | Status: AC
  Filled 2016-12-20: qty 5

## 2016-12-20 MED ORDER — POTASSIUM 99 MG PO TABS: 1.0000 | ORAL_TABLET | Freq: Every day | ORAL | Status: DC

## 2016-12-20 MED ORDER — SODIUM CHLORIDE 0.9% FLUSH
INTRAVENOUS | Status: DC | PRN
  Administered 2016-12-20: 50 mL

## 2016-12-20 MED ORDER — FENTANYL CITRATE (PF) 100 MCG/2ML IJ SOLN
INTRAMUSCULAR | Status: DC | PRN
  Administered 2016-12-20: 25 ug via INTRAVENOUS
  Administered 2016-12-20 (×2): 50 ug via INTRAVENOUS

## 2016-12-20 MED ORDER — SORBITOL 70 % SOLN: 30.0000 mL | Freq: Every day | Status: DC | PRN

## 2016-12-20 MED ORDER — PHENYLEPHRINE HCL 10 MG/ML IJ SOLN
INTRAMUSCULAR | Status: DC | PRN
  Administered 2016-12-20 (×2): 80 ug via INTRAVENOUS
  Administered 2016-12-20: 120 ug via INTRAVENOUS
  Administered 2016-12-20: 80 ug via INTRAVENOUS
  Administered 2016-12-20: 120 ug via INTRAVENOUS
  Administered 2016-12-20: 80 ug via INTRAVENOUS

## 2016-12-20 MED ORDER — PROMETHAZINE HCL 25 MG/ML IJ SOLN: 6.2500 mg | INTRAMUSCULAR | Status: DC | PRN

## 2016-12-20 MED ORDER — ONDANSETRON HCL 4 MG/2ML IJ SOLN
INTRAMUSCULAR | Status: DC | PRN
  Administered 2016-12-20: 4 mg via INTRAVENOUS

## 2016-12-20 MED ORDER — BUPIVACAINE-EPINEPHRINE (PF) 0.25% -1:200000 IJ SOLN
INTRAMUSCULAR | Status: DC | PRN
  Administered 2016-12-20: 50 mL

## 2016-12-20 MED ORDER — HYDROCODONE-ACETAMINOPHEN 5-325 MG PO TABS
1.0000 | ORAL_TABLET | ORAL | Status: DC | PRN
  Administered 2016-12-20: 2 via ORAL

## 2016-12-20 MED ORDER — PROPOFOL 10 MG/ML IV BOLUS
INTRAVENOUS | Status: AC
  Filled 2016-12-20: qty 20

## 2016-12-20 MED ORDER — BUPIVACAINE-EPINEPHRINE (PF) 0.25% -1:200000 IJ SOLN
INTRAMUSCULAR | Status: AC
  Filled 2016-12-20: qty 30

## 2016-12-20 MED ORDER — POLYETHYLENE GLYCOL 3350 17 G PO PACK: 17.0000 g | PACK | Freq: Every day | ORAL | Status: DC | PRN

## 2016-12-20 MED ORDER — CHLORHEXIDINE GLUCONATE 4 % EX LIQD: 60.0000 mL | Freq: Once | CUTANEOUS | Status: DC

## 2016-12-20 MED ORDER — FENTANYL CITRATE (PF) 100 MCG/2ML IJ SOLN
INTRAMUSCULAR | Status: AC
  Administered 2016-12-20: 50 ug via INTRAVENOUS
  Filled 2016-12-20: qty 2

## 2016-12-20 MED ORDER — PROPOFOL 10 MG/ML IV BOLUS
INTRAVENOUS | Status: DC | PRN
  Administered 2016-12-20: 180 mg via INTRAVENOUS

## 2016-12-20 MED ORDER — VITAMIN D 1000 UNITS PO TABS
1000.0000 [IU] | ORAL_TABLET | Freq: Every day | ORAL | Status: DC
  Administered 2016-12-21: 1000 [IU] via ORAL
  Filled 2016-12-20: qty 1

## 2016-12-20 MED ORDER — ACETAMINOPHEN 650 MG RE SUPP: 650.0000 mg | Freq: Four times a day (QID) | RECTAL | Status: DC | PRN

## 2016-12-20 MED ORDER — METOPROLOL TARTRATE 25 MG PO TABS
25.0000 mg | ORAL_TABLET | Freq: Every day | ORAL | Status: DC
Start: 2016-12-21 — End: 2016-12-21
  Administered 2016-12-21: 25 mg via ORAL
  Filled 2016-12-20: qty 1

## 2016-12-20 MED ORDER — METOCLOPRAMIDE HCL 5 MG/ML IJ SOLN: 5.0000 mg | Freq: Three times a day (TID) | INTRAMUSCULAR | Status: DC | PRN

## 2016-12-20 MED ORDER — LISINOPRIL-HYDROCHLOROTHIAZIDE 20-25 MG PO TABS: 1.0000 | ORAL_TABLET | Freq: Every day | ORAL | Status: DC

## 2016-12-20 MED ORDER — MIDAZOLAM HCL 2 MG/2ML IJ SOLN
INTRAMUSCULAR | Status: AC
  Filled 2016-12-20: qty 2

## 2016-12-20 MED ORDER — PROPOFOL 1000 MG/100ML IV EMUL
INTRAVENOUS | Status: AC
  Filled 2016-12-20: qty 100

## 2016-12-20 MED ORDER — CALCIUM CITRATE 150 MG PO CAPS: ORAL_CAPSULE | Freq: Every day | ORAL | Status: DC

## 2016-12-20 MED ORDER — BUPIVACAINE-EPINEPHRINE (PF) 0.5% -1:200000 IJ SOLN
INTRAMUSCULAR | Status: DC | PRN
  Administered 2016-12-20: 30 mL via PERINEURAL

## 2016-12-20 MED ORDER — BUPIVACAINE-EPINEPHRINE (PF) 0.5% -1:200000 IJ SOLN
INTRAMUSCULAR | Status: AC
  Filled 2016-12-20: qty 30

## 2016-12-20 MED ORDER — TRAMADOL HCL 50 MG PO TABS: ORAL_TABLET | ORAL | 0 refills | Status: AC

## 2016-12-20 MED ORDER — LIDOCAINE 2% (20 MG/ML) 5 ML SYRINGE
INTRAMUSCULAR | Status: AC
  Filled 2016-12-20: qty 5

## 2016-12-20 MED ORDER — SODIUM CHLORIDE 0.9 % IR SOLN
Status: DC | PRN
  Administered 2016-12-20: 3000 mL

## 2016-12-20 MED ORDER — VITAMIN B-12 100 MCG PO TABS
100.0000 ug | ORAL_TABLET | Freq: Every day | ORAL | Status: DC
  Administered 2016-12-21: 100 ug via ORAL
  Filled 2016-12-20: qty 1

## 2016-12-20 MED ORDER — FENTANYL CITRATE (PF) 100 MCG/2ML IJ SOLN
25.0000 ug | INTRAMUSCULAR | Status: DC | PRN
  Administered 2016-12-20 (×2): 50 ug via INTRAVENOUS

## 2016-12-20 MED ORDER — LIDOCAINE HCL (CARDIAC) 20 MG/ML IV SOLN
INTRAVENOUS | Status: DC | PRN
  Administered 2016-12-20: 80 mg via INTRATRACHEAL

## 2016-12-20 MED ORDER — DEXAMETHASONE SODIUM PHOSPHATE 10 MG/ML IJ SOLN
INTRAMUSCULAR | Status: DC | PRN
  Administered 2016-12-20: 10 mg via INTRAVENOUS

## 2016-12-20 MED ORDER — METOPROLOL TARTRATE 12.5 MG HALF TABLET
12.5000 mg | ORAL_TABLET | Freq: Once | ORAL | Status: AC
  Administered 2016-12-20: 12.5 mg via ORAL
  Filled 2016-12-20: qty 1

## 2016-12-20 MED ORDER — HYDROCHLOROTHIAZIDE 25 MG PO TABS
25.0000 mg | ORAL_TABLET | Freq: Every day | ORAL | Status: DC
  Administered 2016-12-21: 25 mg via ORAL
  Filled 2016-12-20: qty 1

## 2016-12-20 MED ORDER — LACTATED RINGERS IV SOLN
INTRAVENOUS | Status: DC | PRN
  Administered 2016-12-20 (×2): via INTRAVENOUS

## 2016-12-20 MED ORDER — TRAMADOL HCL 50 MG PO TABS
100.0000 mg | ORAL_TABLET | Freq: Four times a day (QID) | ORAL | Status: DC | PRN
  Administered 2016-12-20 – 2016-12-21 (×2): 100 mg via ORAL
  Filled 2016-12-20 (×2): qty 2

## 2016-12-20 MED ORDER — ACETAMINOPHEN 325 MG PO TABS
650.0000 mg | ORAL_TABLET | Freq: Four times a day (QID) | ORAL | Status: DC | PRN
  Administered 2016-12-21 (×2): 650 mg via ORAL
  Filled 2016-12-20 (×2): qty 2

## 2016-12-20 MED ORDER — MIDAZOLAM HCL 5 MG/5ML IJ SOLN
INTRAMUSCULAR | Status: DC | PRN
  Administered 2016-12-20: 2 mg via INTRAVENOUS

## 2016-12-20 MED ORDER — SENNA 8.6 MG PO TABS
1.0000 | ORAL_TABLET | Freq: Two times a day (BID) | ORAL | Status: DC
  Administered 2016-12-20: 8.6 mg via ORAL
  Filled 2016-12-20 (×2): qty 1

## 2016-12-20 MED ORDER — GLYCOPYRROLATE 0.2 MG/ML IJ SOLN
INTRAMUSCULAR | Status: DC | PRN
  Administered 2016-12-20: 0.2 mg via INTRAVENOUS

## 2016-12-20 MED ORDER — ONDANSETRON HCL 4 MG/2ML IJ SOLN: 4.0000 mg | Freq: Four times a day (QID) | INTRAMUSCULAR | Status: DC | PRN

## 2016-12-20 MED ORDER — METOPROLOL TARTRATE 12.5 MG HALF TABLET
ORAL_TABLET | ORAL | Status: AC
  Administered 2016-12-20: 12.5 mg via ORAL
  Filled 2016-12-20: qty 1

## 2016-12-20 MED ORDER — EPHEDRINE SULFATE 50 MG/ML IJ SOLN
INTRAMUSCULAR | Status: DC | PRN
  Administered 2016-12-20 (×2): 10 mg via INTRAVENOUS

## 2016-12-20 SURGICAL SUPPLY — 65 items
BANDAGE ACE 4X5 VEL STRL LF (GAUZE/BANDAGES/DRESSINGS) ×3 IMPLANT
BANDAGE ACE 6X5 VEL STRL LF (GAUZE/BANDAGES/DRESSINGS) ×3 IMPLANT
BANDAGE ELASTIC 4 VELCRO ST LF (GAUZE/BANDAGES/DRESSINGS) ×3 IMPLANT
BANDAGE ELASTIC 6 VELCRO ST LF (GAUZE/BANDAGES/DRESSINGS) ×3 IMPLANT
BANDAGE ESMARK 6X9 LF (GAUZE/BANDAGES/DRESSINGS) ×1 IMPLANT
BLADE SAGITTAL 25.0X1.19X90 (BLADE) ×2 IMPLANT
BLADE SAGITTAL 25.0X1.19X90MM (BLADE) ×1
BLADE SAW SAG 90X13X1.27 (BLADE) ×3 IMPLANT
BNDG ESMARK 6X9 LF (GAUZE/BANDAGES/DRESSINGS) ×3
BONE CEMENT GENTAMICIN (Cement) ×6 IMPLANT
BOWL SMART MIX CTS (DISPOSABLE) ×3 IMPLANT
CAP KNEE TOTAL 3 SIGMA ×3 IMPLANT
CEMENT BONE GENTAMICIN 40 (Cement) ×2 IMPLANT
COVER SURGICAL LIGHT HANDLE (MISCELLANEOUS) ×3 IMPLANT
CUFF TOURNIQUET SINGLE 34IN LL (TOURNIQUET CUFF) ×3 IMPLANT
CUFF TOURNIQUET SINGLE 44IN (TOURNIQUET CUFF) IMPLANT
DRAPE INCISE IOBAN 66X45 STRL (DRAPES) IMPLANT
DRAPE ORTHO SPLIT 77X108 STRL (DRAPES) ×4
DRAPE SURG ORHT 6 SPLT 77X108 (DRAPES) ×2 IMPLANT
DRAPE U-SHAPE 47X51 STRL (DRAPES) ×3 IMPLANT
DRSG ADAPTIC 3X8 NADH LF (GAUZE/BANDAGES/DRESSINGS) ×3 IMPLANT
DRSG PAD ABDOMINAL 8X10 ST (GAUZE/BANDAGES/DRESSINGS) ×6 IMPLANT
DURAPREP 26ML APPLICATOR (WOUND CARE) ×3 IMPLANT
ELECT REM PT RETURN 9FT ADLT (ELECTROSURGICAL) ×3
ELECTRODE REM PT RTRN 9FT ADLT (ELECTROSURGICAL) ×1 IMPLANT
EVACUATOR 1/8 PVC DRAIN (DRAIN) IMPLANT
FACESHIELD WRAPAROUND (MASK) ×3 IMPLANT
GAUZE SPONGE 4X4 12PLY STRL (GAUZE/BANDAGES/DRESSINGS) ×3 IMPLANT
GLOVE BIOGEL PI IND STRL 8 (GLOVE) ×4 IMPLANT
GLOVE BIOGEL PI INDICATOR 8 (GLOVE) ×8
GLOVE ORTHO TXT STRL SZ7.5 (GLOVE) ×3 IMPLANT
GLOVE SURG ORTHO 8.0 STRL STRW (GLOVE) ×3 IMPLANT
GOWN STRL REUS W/ TWL LRG LVL3 (GOWN DISPOSABLE) ×1 IMPLANT
GOWN STRL REUS W/ TWL XL LVL3 (GOWN DISPOSABLE) ×1 IMPLANT
GOWN STRL REUS W/TWL 2XL LVL3 (GOWN DISPOSABLE) ×3 IMPLANT
GOWN STRL REUS W/TWL LRG LVL3 (GOWN DISPOSABLE) ×2
GOWN STRL REUS W/TWL XL LVL3 (GOWN DISPOSABLE) ×2
HANDPIECE INTERPULSE COAX TIP (DISPOSABLE) ×2
HOOD PEEL AWAY FACE SHEILD DIS (HOOD) ×3 IMPLANT
IMMOBILIZER KNEE 22 UNIV (SOFTGOODS) ×3 IMPLANT
KIT BASIN OR (CUSTOM PROCEDURE TRAY) ×3 IMPLANT
KIT ROOM TURNOVER OR (KITS) ×3 IMPLANT
MANIFOLD NEPTUNE II (INSTRUMENTS) ×3 IMPLANT
NEEDLE 22X1 1/2 (OR ONLY) (NEEDLE) ×6 IMPLANT
NS IRRIG 1000ML POUR BTL (IV SOLUTION) ×3 IMPLANT
PACK TOTAL JOINT (CUSTOM PROCEDURE TRAY) ×3 IMPLANT
PAD ABD 8X10 STRL (GAUZE/BANDAGES/DRESSINGS) ×3 IMPLANT
PAD ARMBOARD 7.5X6 YLW CONV (MISCELLANEOUS) ×6 IMPLANT
PAD CAST 4YDX4 CTTN HI CHSV (CAST SUPPLIES) ×1 IMPLANT
PADDING CAST COTTON 4X4 STRL (CAST SUPPLIES) ×2
PADDING CAST COTTON 6X4 STRL (CAST SUPPLIES) ×3 IMPLANT
SET HNDPC FAN SPRY TIP SCT (DISPOSABLE) ×1 IMPLANT
STAPLER VISISTAT 35W (STAPLE) ×3 IMPLANT
SUCTION FRAZIER HANDLE 10FR (MISCELLANEOUS) ×2
SUCTION TUBE FRAZIER 10FR DISP (MISCELLANEOUS) ×1 IMPLANT
SUT ETHIBOND NAB CT1 #1 30IN (SUTURE) ×9 IMPLANT
SUT VIC AB 0 CT1 27 (SUTURE) ×2
SUT VIC AB 0 CT1 27XBRD ANBCTR (SUTURE) ×1 IMPLANT
SUT VIC AB 2-0 CT1 27 (SUTURE) ×4
SUT VIC AB 2-0 CT1 TAPERPNT 27 (SUTURE) ×2 IMPLANT
SYR CONTROL 10ML LL (SYRINGE) ×6 IMPLANT
TOWEL GREEN STERILE (TOWEL DISPOSABLE) ×3 IMPLANT
TRAY CATH 16FR W/PLASTIC CATH (SET/KITS/TRAYS/PACK) IMPLANT
TRAY FOLEY W/METER SILVER 16FR (SET/KITS/TRAYS/PACK) IMPLANT
WATER STERILE IRR 1000ML POUR (IV SOLUTION) ×3 IMPLANT

## 2016-12-20 NOTE — Transfer of Care (Signed)
Immediate Anesthesia Transfer of Care Note  Patient: ALF DOYLE  Procedure(s) Performed: LEFT TOTAL KNEE ARTHROPLASTY (Left Knee)  Patient Location: PACU  Anesthesia Type:General  Level of Consciousness: awake, alert , oriented and patient cooperative  Airway & Oxygen Therapy: Patient Spontanous Breathing and Patient connected to nasal cannula oxygen  Post-op Assessment: Report given to RN  Post vital signs: Reviewed and stable  Last Vitals:  Vitals:   12/20/16 0600 12/20/16 1000  BP: (!) 145/96 124/82  Pulse:  73  Resp:  (!) 28  Temp:  36.5 C  SpO2:  100%    Last Pain:  Vitals:   12/20/16 1000  TempSrc:   PainSc: (P) 0-No pain         Complications: No apparent anesthesia complications

## 2016-12-20 NOTE — Op Note (Signed)
NAME:  MASAMI, PLATA                    ACCOUNT NO.:  MEDICAL RECORD NO.:  [id number]  LOCATION:                                 FACILITY:  PHYSICIAN:  Dyke Brackett, M.D.    DATE OF BIRTH:  Dec 10, 1950  DATE OF PROCEDURE:  12/20/2016 DATE OF DISCHARGE:                              OPERATIVE REPORT   PREOPERATIVE DIAGNOSIS:  Severe osteoarthritis, left knee with varus deformity.  POSTOPERATIVE DIAGNOSIS:  Severe osteoarthritis, left knee with varus deformity.  OPERATION:  Left total knee replacement (Sigma size 4 femur, size 5 tibia, size 4 10 mm tibial bearing, and 38 mm all poly patella).  SURGEON:  Dyke Brackett, M.D.  ASSISTANTVincent Peyer, PA.  TOURNIQUET TIME:  One hour.  ANESTHESIA:  General.  DESCRIPTION OF PROCEDURE:  General anesthetic, supine positioning, tourniquet inflation after exsanguination to 350, straight skin incision with medial parapatellar approach to the knee made.  We cut an 11 mm 5- degree valgus cut on the femur.  We then cut about 3-4 mm below the most diseased medial compartment after stripping on the medial side of the knee due to varus deformity.  There was a mild flexion contracture. Extension gap was measured at 10 mm with no flexion contracture noted. Femur was sized to be a size 4 followed by placement of the all-in-1 cutting block appropriate degree of external rotation with a cutting the anterior, posterior, and chamfer cuts.  PCL was released.  Excess meniscus was removed as well as stripping of the posterior capsule and posterior osteophytes.  Flexion gap equaled the extension gap at 10 mm. Tibia was sized to be a size 5, followed by placement of the keel cut in the trial base plate.  We then cut the box for the femur.  Trial femur was placed.  Again, full extension, good balancing of the ligaments was noted.  The patella was cut leaving 19 mm of patella for a 38 mm all poly trial.  Again, all parameters checked with the trials deemed  to be acceptable.  Cement was applied on the back table with antibiotic. Pulsatile lavage was made on the bone.  The bone surfaces were carefully dried.  Mixture of Marcaine and Exparel was injected into the capsule and subcutaneous tissues.  Final components were inserted, tibia followed by femur and patella.  We used a trial bearing while the cement hardened.  Trial bearing was removed.  No excess cement was noted. Again, the tourniquet was released once the cement was hardened, no excessive bleeding was noted.  Small bleeders were coagulated.  Final bearing was placed.  Closure was affected with #1 Ethibond, 2-0 Vicryl, and skin clips.  Lightly compressive sterile dressing, knee immobilizer applied.     Dyke Brackett, M.D.     WDC/MEDQ  D:  12/20/2016  T:  12/20/2016  Job:  [id number]

## 2016-12-20 NOTE — Progress Notes (Signed)
Patient has scratch to left knee. Per patient, Dr. Madelon Lips saw this on Wednesday and stated it was OK for surgery. Patient scratched knee when re-roofing house on Monday

## 2016-12-20 NOTE — Anesthesia Procedure Notes (Signed)
Spinal  Patient location during procedure: OR Start time: 12/20/2016 7:46 AM End time: 12/20/2016 8:02 AM Staffing Anesthesiologist: Leslye Peer E Performed: anesthesiologist  Preanesthetic Checklist Completed: patient identified, surgical consent, pre-op evaluation, timeout performed, IV checked, risks and benefits discussed and monitors and equipment checked Spinal Block Patient position: sitting Prep: DuraPrep Patient monitoring: heart rate, cardiac monitor, continuous pulse ox and blood pressure Approach: midline Location: L2-3 Injection technique: single-shot Needle Needle type: Pencan and Tuohy  Needle gauge: 24 G Additional Notes Functioning IV was confirmed and monitors were applied. Sterile prep and drape, including hand hygiene, mask, and sterile gloves were used. The patient was positioned and the spine was prepped. The skin was anesthetized with lidocaine. After unsuccessful attempt with spinal introducer and pencan needle, opted to use Tuohy to locate epidural space. Went one level higher than initial attempt, but despite multiple redirections, still unable to locate identifiable structures. Aborted procedure and opted for general anesthetic.The patient tolerated the procedure well. Consent was obtained prior to the procedure with all questions answered and concerns addressed. Risks including, but not limited to, bleeding, infection, nerve damage, paralysis, failed block, inadequate analgesia, allergic reaction, high spinal, itching, and headache were discussed and the patient wished to proceed.  Leslye Peer, MD

## 2016-12-20 NOTE — H&P (View-Only) (Signed)
TOTAL KNEE ADMISSION H&P  Patient is being admitted for left total knee arthroplasty.  Subjective:  Chief Complaint:left knee pain.  HPI: Oscar Melendez, 66 y.o. male, has a history of pain and functional disability in the left knee due to arthritis and has failed non-surgical conservative treatments for greater than 12 weeks to includeNSAID's and/or analgesics, corticosteriod injections, viscosupplementation injections and activity modification.  Onset of symptoms was gradual, starting 8 years ago with gradually worsening course since that time. The patient noted no past surgery on the left knee(s).  Patient currently rates pain in the left knee(s) at 8 out of 10 with activity. Patient has night pain, worsening of pain with activity and weight bearing, pain that interferes with activities of daily living, pain with passive range of motion, crepitus and joint swelling.  Patient has evidence of periarticular osteophytes and joint space narrowing by imaging studies.  There is no active infection.  Patient Active Problem List   Diagnosis Date Noted  . Hypertension   . Hyperlipemia   . Primary localized osteoarthritis of right knee 08/30/2016  . CAD (coronary artery disease) 07/09/2016   Past Medical History:  Diagnosis Date  . Atherosclerotic heart disease of native coronary artery without angina pectoris   . Atrial fibrillation (HCC)   . CAD (coronary artery disease) 07/09/2016  . Chronic knee pain   . Dermatophytosis of nail   . H/O inguinal hernia repair   . History of kidney stones    has passed 3 stones  . Hyperlipemia   . Hypertension   . Impotence of organic origin   . Incomplete RBBB   . Morbid obesity (HCC)   . Onychomycosis   . Osteoarthrosis    knees  . PONV (postoperative nausea and vomiting)    nausea only  . RCA occlusion (HCC)   . Stroke [facility](HCC) 1998   lost speech, regained it in 2months  . Transient cerebral ischemia     Past Surgical History:  Procedure  Laterality Date  . HERNIA REPAIR    . INNER EAR SURGERY Left prior to 1990  . JOINT REPLACEMENT    . 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 TOTAL KNEE ARTHROPLASTY Right 08/30/2016   Procedure: RIGHT TOTAL KNEE ARTHROPLASTY;  Surgeon: Frederico Hammanaffrey, Daniel, MD;  [facility];  Service: Orthopedics;  Laterality: Right;     (Not in a hospital admission) Allergies  Allergen Reactions  . Dye Fdc Red [Red Dye] Other (See Comments)    BURNING SENSATION    Social History  Substance Use Topics  . Smoking status: Never Smoker  . Smokeless tobacco: Never Used  . Alcohol use No    Family History  Problem Relation Age of Onset  . Heart disease Mother   . Cancer Brother      Review of Systems  HENT: Positive for hearing loss.   Gastrointestinal: Positive for blood in stool and melena.  Genitourinary: Positive for hematuria.  Musculoskeletal: Positive for joint pain.  All other systems reviewed and are negative.   Objective:  Physical Exam  Constitutional: He is oriented to person, place, and time. He appears well-developed and well-nourished. No distress.  HENT:  Head: Normocephalic and atraumatic.  Nose: Nose normal.  Eyes: Pupils are equal, round, and reactive to light. Conjunctivae and EOM are normal.  Neck: Normal range of motion. Neck supple.  Cardiovascular: Normal rate, regular rhythm, normal heart sounds and intact distal pulses.   Respiratory: Effort normal and breath sounds normal. No respiratory  distress. He has no wheezes.  GI: Soft. Bowel sounds are normal. He exhibits no distension. There is no tenderness.  Musculoskeletal:       Left knee: He exhibits normal range of motion. Tenderness found.  Lymphadenopathy:    He has no cervical adenopathy.  Neurological: He is alert and oriented to person, place, and time. No cranial nerve deficit.  Skin: Skin is warm and dry. No rash noted. No erythema.  Psychiatric: He has a normal mood and affect. His behavior is normal.     Vital signs in last 24 hours: @  Labs:   Estimated body mass index is 36.18 kg/m as calculated from the following:   Height as of 08/19/16: 6' (1.829 m).   Weight as of 08/19/16: 121 kg (266 lb 12.8 oz).   Imaging Review Plain radiographs demonstrate moderate degenerative joint disease of the left knee(s). The overall alignment ismild varus. The bone quality appears to be good for age and reported activity level.  Assessment/Plan:  End stage arthritis,left knee   The patient history, physical examination, clinical judgment of the provider and imaging studies are consistent with end stage degenerative joint disease of the left knee(s) and total knee arthroplasty is deemed medically necessary. The treatment options including medical management, injection therapy arthroscopy and arthroplasty were discussed at length. The risks and benefits of total knee arthroplasty were presented and reviewed. The risks due to aseptic loosening, infection, stiffness, patella tracking problems, thromboembolic complications and other imponderables were discussed. The patient acknowledged the explanation, agreed to proceed with the plan and consent was signed. Patient is being admitted for inpatient treatment for surgery, pain control, PT, OT, prophylactic antibiotics, VTE prophylaxis, progressive ambulation and ADL's and discharge planning. The patient is planning to be discharged home with home health services

## 2016-12-20 NOTE — Anesthesia Procedure Notes (Signed)
Anesthesia Regional Block: Adductor canal block   Pre-Anesthetic Checklist: ,, timeout performed, Correct Patient, Correct Site, Correct Laterality, Correct Procedure, Correct Position, site marked, Risks and benefits discussed,  Surgical consent,  Pre-op evaluation,  At surgeon's request and post-op pain management  Laterality: Left  Prep: chloraprep       Needles:  Injection technique: Single-shot  Needle Type: Echogenic Needle     Needle Length: 9cm  Needle Gauge: 21     Additional Needles:   Narrative:  Start time: 12/20/2016 7:09 AM End time: 12/20/2016 7:12 AM Injection made incrementally with aspirations every 5 mL.  Performed by: Personally  Anesthesiologist: Leslye Peer E  Additional Notes: No pain on injection. No increased resistance to injection. Injection made in 5cc increments. Good needle visualization. Patient tolerated the procedure well.

## 2016-12-20 NOTE — Progress Notes (Signed)
Orthopedic Tech Progress Note Patient Details:  Oscar Melendez Jun 14, 1950 [id number]      [name] 12/20/2016, 10:32 AM Trapeze bar patient helper not applied because pt's weight exceeds durability of frame; RN notified

## 2016-12-20 NOTE — Anesthesia Postprocedure Evaluation (Signed)
Anesthesia Post Note  Patient: Oscar Melendez  Procedure(s) Performed: LEFT TOTAL KNEE ARTHROPLASTY (Left Knee)     Patient location during evaluation: PACU Anesthesia Type: General Level of consciousness: awake and alert Pain management: pain level controlled Vital Signs Assessment: post-procedure vital signs reviewed and stable Respiratory status: spontaneous breathing, nonlabored ventilation and respiratory function stable Cardiovascular status: blood pressure returned to baseline and stable Postop Assessment: no apparent nausea or vomiting Anesthetic complications: no    Last Vitals:  Vitals:   12/20/16 1130 12/20/16 1145  BP:  (!) 146/105  Pulse: (!) 55 (!) 58  Resp: 11 10  Temp:    SpO2: 96% 97%    Last Pain:  Vitals:   12/20/16 1145  TempSrc:   PainSc: Asleep                 [name]

## 2016-12-20 NOTE — Evaluation (Signed)
Physical Therapy Evaluation Patient Details Name: Oscar Melendez MRN: [id number] DOB: Mar 01, 1951 Today's Date: 12/20/2016   History of Present Illness  Pt is a 66 y.o. male s/p L TKA on 12/20/16. PMH includes R TKA (~3 mo ago), CAD, a-fib, HTN, CVA, obesity.  Clinical Impression  Pt presents with an overall decrease in functional mobility secondary to above. PTA, pt indep and lives at home with wife. Educ on precautions, positioning, therex, and importance of mobility. Today, pt able to transfer and amb with RW and min guard; pt motivated to participate with therapy and feel he will progress well. Wife present throughout session and very supportive. Pt would benefit from continued acute PT services to maximize functional mobility and independence prior to d/c with HHPT.     Follow Up Recommendations DC plan and follow up therapy as arranged by surgeon;Home health PT    Equipment Recommendations  None recommended by PT (owns DME)    Recommendations for Other Services OT consult     Precautions / Restrictions Precautions Precautions: Knee Precaution Booklet Issued: No Precaution Comments: Verball reviewed precautions Required Braces or Orthoses: Knee Immobilizer - Left Knee Immobilizer - Left: On when out of bed or walking;Other (comment) (order to d/c knee immobilizer on 10/6) Restrictions Weight Bearing Restrictions: Yes LLE Weight Bearing: Weight bearing as tolerated      Mobility  Bed Mobility Overal bed mobility: Needs Assistance Bed Mobility: Supine to Sit     Supine to sit: HOB elevated;Modified independent (Device/Increase time)        Transfers Overall transfer level: Needs assistance Equipment used: Rolling walker (2 wheeled) Transfers: Sit to/from Stand Sit to Stand: Min guard;From elevated surface         General transfer comment: Able to stand on 2nd attempt with RW and min guard; cues for hand placement on RW  Ambulation/Gait Ambulation/Gait  assistance: Min guard Ambulation Distance (Feet): 120 Feet Assistive device: Rolling walker (2 wheeled) Gait Pattern/deviations: Step-through pattern;Decreased stride length;Decreased weight shift to left;Antalgic;Trunk flexed Gait velocity: Decreased Gait velocity interpretation: <1.8 ft/sec, indicative of risk for recurrent falls General Gait Details: Slow, controlled gait with RW and min guard for balance. Good technique with step-through and heel-to-toe gait pattern  Stairs            Wheelchair Mobility    Modified Rankin (Stroke Patients Only)       Balance Overall balance assessment: Needs assistance Sitting-balance support: No upper extremity supported;Feet supported;Feet unsupported Sitting balance-Leahy Scale: Good     Standing balance support: Bilateral upper extremity supported;Single extremity supported;During functional activity Standing balance-Leahy Scale: Fair Standing balance comment: Able to static stand with no UE support and min guard                             Pertinent Vitals/Pain Pain Assessment: No/denies pain    Home Living Family/patient expects to be discharged to:: Private residence Living Arrangements: Spouse/significant other;Children Available Help at Discharge: Family;Available 24 hours/day Type of Home: House Home Access: Ramped entrance     Home Layout: One level Home Equipment: Walker - 2 wheels;Bedside commode      Prior Function Level of Independence: Independent         Comments: Exercises regularly     Hand Dominance        Extremity/Trunk Assessment   Upper Extremity Assessment Upper Extremity Assessment: Overall WFL for tasks assessed    Lower Extremity Assessment Lower Extremity  Assessment: LLE deficits/detail LLE Deficits / Details: s/p L TKA; hip flexion & knee ext 3/5 LLE Sensation: decreased light touch (at knee from local anesthesia)       Communication   Communication: No  difficulties  Cognition Arousal/Alertness: Awake/alert Behavior During Therapy: WFL for tasks assessed/performed Overall Cognitive Status: Within Functional Limits for tasks assessed                                        General Comments General comments (skin integrity, edema, etc.): Wife present throughout session and very supportive    Exercises Total Joint Exercises Long Arc Quad: AROM;Left;10 reps;Seated Knee Flexion: AAROM;Left;15 reps;Seated Marching in Standing: AROM;Left;10 reps;Seated   Assessment/Plan    PT Assessment Patient needs continued PT services  PT Problem List Decreased strength;Decreased range of motion;Decreased activity tolerance;Decreased balance;Decreased mobility;Pain       PT Treatment Interventions DME instruction;Gait training;Stair training;Functional mobility training;Therapeutic activities;Therapeutic exercise;Patient/family education    PT Goals (Current goals can be found in the Care Plan section)  Acute Rehab PT Goals Patient Stated Goal: Return home PT Goal Formulation: With patient Time For Goal Achievement: 01/03/17 Potential to Achieve Goals: Good    Frequency 7X/week   Barriers to discharge        Co-evaluation               AM-PAC PT "6 Clicks" Daily Activity  Outcome Measure Difficulty turning over in bed (including adjusting bedclothes, sheets and blankets)?: A Little Difficulty moving from lying on back to sitting on the side of the bed? : A Little Difficulty sitting down on and standing up from a chair with arms (e.g., wheelchair, bedside commode, etc,.)?: A Little Help needed moving to and from a bed to chair (including a wheelchair)?: A Little Help needed walking in hospital room?: A Little Help needed climbing 3-5 steps with a railing? : A Little 6 Click Score: 18    End of Session Equipment Utilized During Treatment: Gait belt Activity Tolerance: Patient tolerated treatment well Patient left:  in chair;with call bell/phone within reach;with family/visitor present Nurse Communication: Mobility status PT Visit Diagnosis: Other abnormalities of gait and mobility (R26.89)    Time: 0981-1914 PT Time Calculation (min) (ACUTE ONLY): 26 min   Charges:   PT Evaluation $PT Eval Low Complexity: 1 Low PT Treatments $Gait Training: 8-22 mins   PT G Codes:       Ina Homes, PT, DPT Acute Rehab Services  Pager: [phone]  Malachy Chamber 12/20/2016, 4:41 PM

## 2016-12-20 NOTE — Interval H&P Note (Signed)
History and Physical Interval Note:  12/20/2016 7:26 AM  Oscar Melendez  has presented today for surgery, with the diagnosis of OA LEFT KNEE  The various methods of treatment have been discussed with the patient and family. After consideration of risks, benefits and other options for treatment, the patient has consented to  Procedure(s): LEFT TOTAL KNEE ARTHROPLASTY (Left) as a surgical intervention .  The patient's history has been reviewed, patient examined, no change in status, stable for surgery.  I have reviewed the patient's chart and labs.  Questions were answered to the patient's satisfaction.     Shaquetta Arcos JR,W D

## 2016-12-20 NOTE — Anesthesia Procedure Notes (Signed)
Procedure Name: LMA Insertion Date/Time: 12/20/2016 8:03 AM Performed by: Rogelia Boga Pre-anesthesia Checklist: Patient identified, Emergency Drugs available, Suction available, Patient being monitored and Timeout performed Patient Re-evaluated:Patient Re-evaluated prior to induction Oxygen Delivery Method: Circle system utilized Preoxygenation: Pre-oxygenation with 100% oxygen Induction Type: IV induction LMA: LMA inserted LMA Size: 5.0 Number of attempts: 1 Placement Confirmation: positive ETCO2 and breath sounds checked- equal and bilateral Tube secured with: Tape

## 2016-12-20 NOTE — Brief Op Note (Signed)
12/20/2016  10:00 AM  PATIENT:  Oscar Melendez  66 y.o. male  PRE-OPERATIVE DIAGNOSIS:  OA LEFT KNEE  POST-OPERATIVE DIAGNOSIS:  OA LEFT KNEE  PROCEDURE:  Procedure(s): LEFT TOTAL KNEE ARTHROPLASTY (Left)  SURGEON:  Surgeon(s) and Role:    Frederico Hamman, MD - Primary  PHYSICIAN ASSISTANT: Margart Sickles, PA-C  ASSISTANTS: OR staff x1   ANESTHESIA:   local and general  EBL:  Total I/O In: 1000 [I.V.:1000] Out: -   BLOOD ADMINISTERED:none  DRAINS: none   LOCAL MEDICATIONS USED:  MARCAINE     SPECIMEN:  No Specimen  DISPOSITION OF SPECIMEN:  N/A  COUNTS:  YES  TOURNIQUET:   Total Tourniquet Time Documented: Thigh (Left) - 61 minutes Total: Thigh (Left) - 61 minutes   DICTATION: .Other Dictation: Dictation Number unknown  PLAN OF CARE: Admit to inpatient   PATIENT DISPOSITION:  PACU - hemodynamically stable.   Delay start of Pharmacological VTE agent (>24hrs) due to surgical blood loss or risk of bleeding: yes

## 2016-12-20 NOTE — Care Management Note (Signed)
Case mCase Management Note  Patient Details  Name: JIREH ELMORE MRN: [id number] Date of Birth: 01/07/1951  Subjective/Objective:  66 yr old gentleman s/p left total knee arthroplasty.                 Action/Plan: Case manager spoke with patient's wife while he was doing therapy. He was preoperatively setup with [facility], no changes. They have 3in1 and RW, CPM has been deliveredy to the home. Patient will have family support at discharge.    Expected Discharge Date:    12/21/16              Expected Discharge Plan:  Home w Home Health Services  In-House Referral:  NA  Discharge planning Services  CM Consult  Post Acute Care Choice:  Home Health Choice offered to:  Patient  DME Arranged:  3-N-1, CPM, Walker rolling DME Agency:  [employer]  HH Arranged:  PT HH Agency:  [facility]  Status of Service:  In process, will continue to follow  If discussed at Long Length of Stay Meetings, dates discussed:    Additional Comments:  Durenda Guthrie, RN 12/20/2016, 4:19 PM

## 2016-12-21 DIAGNOSIS — M1712 Unilateral primary osteoarthritis, left knee: Secondary | ICD-10-CM | POA: Diagnosis not present

## 2016-12-21 LAB — CBC
HCT: 42.3 % (ref 39.0–52.0)
HEMOGLOBIN: 13.7 g/dL (ref 13.0–17.0)
MCH: 29.3 pg (ref 26.0–34.0)
MCHC: 32.4 g/dL (ref 30.0–36.0)
MCV: 90.4 fL (ref 78.0–100.0)
Platelets: 158 10*3/uL (ref 150–400)
RBC: 4.68 MIL/uL (ref 4.22–5.81)
RDW: 14.9 % (ref 11.5–15.5)
WBC: 9.4 10*3/uL (ref 4.0–10.5)

## 2016-12-21 LAB — BASIC METABOLIC PANEL
Anion gap: 9 (ref 5–15)
BUN: 14 mg/dL (ref 6–20)
CHLORIDE: 103 mmol/L (ref 101–111)
CO2: 24 mmol/L (ref 22–32)
Calcium: 8.5 mg/dL — ABNORMAL LOW (ref 8.9–10.3)
Creatinine, Ser: 1.13 mg/dL (ref 0.61–1.24)
GFR calc Af Amer: >60 mL/min (ref >60)
GLUCOSE: 133 mg/dL — AB (ref 65–99)
POTASSIUM: 3.9 mmol/L (ref 3.5–5.1)
SODIUM: 136 mmol/L (ref 135–145)

## 2016-12-21 NOTE — Discharge Summary (Signed)
PATIENT ID: Oscar Melendez        MRN:  [id number]          DOB/AGE: 68-Aug-1952 / 68 y.o.    DISCHARGE SUMMARY  ADMISSION DATE:    12/20/2016 DISCHARGE DATE:   12/21/2016   ADMISSION DIAGNOSIS: OA LEFT KNEE    DISCHARGE DIAGNOSIS:  OA LEFT KNEE    ADDITIONAL DIAGNOSIS: Active Problems:   Primary localized osteoarthritis of left knee  Past Medical History:  Diagnosis Date  . Atherosclerotic heart disease of native coronary artery without angina pectoris   . Atrial fibrillation (HCC)   . CAD (coronary artery disease) 07/09/2016  . Chronic knee pain   . Dermatophytosis of nail   . H/O inguinal hernia repair   . History of kidney stones    has passed 3 stones  . Hyperlipemia   . Hypertension   . Impotence of organic origin   . Incomplete RBBB   . Morbid obesity (HCC)   . Onychomycosis   . Osteoarthrosis    knees  . PONV (postoperative nausea and vomiting)    nausea only  . RCA occlusion (HCC)   . Stroke [facility]) 1998   lost speech, regained it in 2months  . Transient cerebral ischemia     PROCEDURE: Procedure(s): LEFT TOTAL KNEE ARTHROPLASTY Left on 12/20/2016  CONSULTS: PT/OT    HISTORY:  See H&P in chart  HOSPITAL COURSE:  Oscar Melendez is a 66 y.o. admitted on 12/20/2016 and found to have a diagnosis of OA LEFT KNEE.  After appropriate laboratory studies were obtained  they were taken to the operating room on 12/20/2016 and underwent  Procedure(s): LEFT TOTAL KNEE ARTHROPLASTY  Left.   They were given perioperative antibiotics:  Anti-infectives    Start     Dose/Rate Route Frequency Ordered Stop   12/20/16 1400  ceFAZolin (ANCEF) IVPB 1 g/50 mL premix     1 g 100 mL/hr over 30 Minutes Intravenous Every 6 hours 12/20/16 1253 12/21/16 0159   12/20/16 0700  ceFAZolin (ANCEF) 3 g in dextrose 5 % 50 mL IVPB     3 g 130 mL/hr over 30 Minutes Intravenous To [facility]Stay [facility] 12/19/16 0912 12/20/16 0745    .  Tolerated the procedure well.   POD #1, allowed out of  bed to a chair.  PT for ambulation and exercise program.    IV saline locked.  O2 discontionued.   The remainder of the hospital course was dedicated to ambulation and strengthening.   The patient was discharged on 1 Day Post-Op in  Stable condition.  Blood products given:none  DIAGNOSTIC STUDIES: Recent vital signs: Patient Vitals for the past 24 hrs:  BP Temp Temp src Pulse Resp SpO2  12/21/16 0446 (!) 141/85 98.2 F (36.8 C) Oral 65 - 95 %  12/20/16 2137 133/71 98.4 F (36.9 C) Oral 81 - 94 %  12/20/16 1500 (!) 152/98 (!) 97.4 F (36.3 C) Oral 68 16 96 %  12/20/16 1249 (!) 145/101 (!) 97.3 F (36.3 C) Oral 61 16 100 %  12/20/16 1224 - (!) 97 F (36.1 C) - 70 15 95 %  12/20/16 1215 (!) 149/95 - - 62 18 93 %  12/20/16 1200 - - - 60 14 97 %  12/20/16 1145 (!) 146/105 - - (!) 58 10 97 %  12/20/16 1130 - - - (!) 55 11 96 %  12/20/16 1115 (!) 166/96 - - 68 19 99 %  12/20/16 1045 (!) 144/97 - -  62 (!) 9 98 %  12/20/16 1041 - - - 65 13 100 %  12/20/16 1038 - - - 65 14 100 %  12/20/16 1030 (!) 137/92 - - 64 13 100 %  12/20/16 1015 (!) 154/93 - - 64 12 99 %  12/20/16 1000 124/82 97.7 F (36.5 C) - 73 (!) 28 100 %       Recent laboratory studies:  Recent Labs  12/21/16 0422  WBC 9.4  HGB 13.7  HCT 42.3  PLT 158    Recent Labs  12/21/16 0422  NA 136  K 3.9  CL 103  CO2 24  BUN 14  CREATININE 1.13  GLUCOSE 133*  CALCIUM 8.5*   Lab Results  Component Value Date   INR 1.02 08/19/2016     Recent Radiographic Studies :  No results found.  DISCHARGE INSTRUCTIONS:   DISCHARGE MEDICATIONS:   Allergies as of 12/21/2016      Reactions   Dye Fdc Red [red Dye] Other (See Comments)   BURNING SENSATION   Doxycycline Other (See Comments)   Stomach pain       Medication List    TAKE these medications   acetaminophen 500 MG tablet Commonly known as:  TYLENOL Take 500 mg by mouth every 6 (six) hours as needed.   apixaban 2.5 MG Tabs tablet Commonly known as:   ELIQUIS Take 1 tablet (2.5 mg total) by mouth 2 (two) times daily.   aspirin EC 81 MG tablet Take 81 mg by mouth daily.   b complex vitamins capsule Take 1 capsule by mouth daily.   CAL-CITRATE PO Take 1 tablet by mouth daily.   FIBER PO Take 1 tablet by mouth daily.   HYDROcodone-acetaminophen 5-325 MG tablet Commonly known as:  NORCO Take 1-2 tablets by mouth every 4 (four) hours as needed for moderate pain.   lisinopril-hydrochlorothiazide 20-25 MG tablet Commonly known as:  PRINZIDE,ZESTORETIC Take 1 tablet by mouth daily.   Magnesium 100 MG Tabs Take 1 tablet by mouth daily.   metoprolol tartrate 50 MG tablet Commonly known as:  LOPRESSOR Take 25 mg by mouth every morning. Per patient he takes 12.5mg  tablet now.   Potassium 99 MG Tabs Take 1 tablet by mouth daily.   traMADol 50 MG tablet Commonly known as:  ULTRAM 1-2 tabs po q6hrs prn pain What changed:  how much to take  how to take this  when to take this  reasons to take this  additional instructions   Vitamin D (Cholecalciferol) 1000 units Caps Take 1 capsule by mouth daily.       FOLLOW UP VISIT:   Follow-up Information    Frederico Hamman, MD. Schedule an appointment as soon as possible for a visit in 2 week(s).   Specialty:  Orthopedic Surgery Contact information: [address] [place] [address] [phone]        [facility] Follow up.   Specialty:  Home Health Services Why:  A representative from [facility] will contact you to arrange start date and time for your therapy. Contact information: [address] [place] [address] [place] [address] [phone]           DISPOSITION:   Home  CONDITION:  Stable   Margart Sickles, PA-C   12/21/2016 7:41 AM

## 2016-12-21 NOTE — Care Management Obs Status (Signed)
MEDICARE OBSERVATION STATUS NOTIFICATION   Patient Details  Name: Oscar Melendez MRN: [id number] Date of Birth: Nov 22, 1950   Medicare Observation Status Notification Given:  Yes    CrutchfieldDerrill Memo, RN 12/21/2016, 9:38 AM

## 2016-12-21 NOTE — Care Management Note (Signed)
Case Management Note  Patient Details  Name: Oscar Melendez MRN: [id number] Date of Birth: 07-09-1950  Subjective/Objective: Became aware that pt would be on Eliquis x 30 days after discharge, given Coupon for 30d free. Since this is for short term use only, suggested he ask MD for samples if his course of tx is for 6 weeks.                    Action/Plan:CM will sign off for now but will be available should additional discharge needs arise or disposition change.    Expected Discharge Date:  12/21/16               Expected Discharge Plan:  Home w Home Health Services  In-House Referral:  NA  Discharge planning Services  CM Consult  Post Acute Care Choice:  Home Health Choice offered to:  Patient  DME Arranged:  3-N-1, CPM, Walker rolling DME Agency:  [employer]  HH Arranged:  PT HH Agency:  [facility] [employer] [facility]  Status of Service:  Completed, signed off  If discussed at [employer] [facility] [employer], dates discussed:    Additional Comments:  Yvone Neu, RN 12/21/2016, 9:57 AM

## 2016-12-21 NOTE — Progress Notes (Signed)
Subjective: 1 Day Post-Op Procedure(s) (LRB): LEFT TOTAL KNEE ARTHROPLASTY (Left) Patient reports pain as mild.    Objective: Vital signs in last 24 hours: Temp:  [97 F (36.1 C)-98.4 F (36.9 C)] 98.2 F (36.8 C) (10/06 0446) Pulse Rate:  [55-81] 65 (10/06 0446) Resp:  [9-28] 16 (10/05 1500) BP: (124-166)/(71-105) 141/85 (10/06 0446) SpO2:  [93 %-100 %] 95 % (10/06 0446)  Intake/Output from previous day: 10/05 0701 - 10/06 0700 In: 1500 [P.O.:200; I.V.:1300] Out: 1250 [Urine:1250] Intake/Output this shift: No intake/output data recorded.   Recent Labs  12/21/16 0422  HGB 13.7    Recent Labs  12/21/16 0422  WBC 9.4  RBC 4.68  HCT 42.3  PLT 158    Recent Labs  12/21/16 0422  NA 136  K 3.9  CL 103  CO2 24  BUN 14  CREATININE 1.13  GLUCOSE 133*  CALCIUM 8.5*   No results for input(s): LABPT, INR in the last 72 hours.  Neurovascular intact Sensation intact distally Intact pulses distally Dorsiflexion/Plantar flexion intact Incision: dressing C/D/I  Assessment/Plan: 1 Day Post-Op Procedure(s) (LRB): LEFT TOTAL KNEE ARTHROPLASTY (Left) Up with therapy Discharge home with home health  Margart Sickles 12/21/2016, 7:41 AM

## 2016-12-21 NOTE — Progress Notes (Signed)
Physical Therapy Treatment Patient Details Name: Oscar Melendez MRN: [id number] DOB: 1950-05-08 Today's Date: 12/21/2016    History of Present Illness Pt is a 66 y.o. male s/p L TKA on 12/20/16. PMH includes R TKA (~3 mo ago), CAD, a-fib, HTN, CVA, obesity.    PT Comments    Pt reports increased pain and stiffness this AM. Today's skilled session focused on gait training and completing HEP before d/c home this PM. Pt is progressing well towards goals and would benefit from continued skilled PT at home to increase safety with mobility and functional independence.     Follow Up Recommendations  DC plan and follow up therapy as arranged by surgeon;Home health PT     Equipment Recommendations  None recommended by PT (owns DME)    Recommendations for Other Services OT consult     Precautions / Restrictions Precautions Precautions: Knee Precaution Booklet Issued: No Precaution Comments: Verball reviewed precautions Required Braces or Orthoses: Knee Immobilizer - Left (discontinue) Knee Immobilizer - Left: On when out of bed or walking;Other (comment) (order to d/c knee immobilizer on 10/6) Restrictions Weight Bearing Restrictions: Yes LLE Weight Bearing: Weight bearing as tolerated    Mobility  Bed Mobility Overal bed mobility: Modified Independent Bed Mobility: Supine to Sit     Supine to sit: HOB elevated;Modified independent (Device/Increase time)     General bed mobility comments: no cueing or physical assist needed. Increased time required.  Transfers Overall transfer level: Needs assistance Equipment used: Rolling walker (2 wheeled) Transfers: Sit to/from Stand Sit to Stand: Min guard         General transfer comment: Pt required two attempts and cues for technique to rise into standing from EOB.  Ambulation/Gait Ambulation/Gait assistance: Min guard Ambulation Distance (Feet): 100 Feet Assistive device: Rolling walker (2 wheeled) Gait Pattern/deviations:  Step-through pattern;Decreased stride length;Decreased weight shift to left;Antalgic;Trunk flexed Gait velocity: Decreased Gait velocity interpretation: Below normal speed for age/gender General Gait Details: Slow, controlled gait with RW and min guard for balance. Good technique with step-through, cueing required for postural control and increased knee flexion.   Stairs            Wheelchair Mobility    Modified Rankin (Stroke Patients Only)       Balance Overall balance assessment: Needs assistance Sitting-balance support: No upper extremity supported;Feet supported;Feet unsupported Sitting balance-Leahy Scale: Good     Standing balance support: Bilateral upper extremity supported;Single extremity supported;During functional activity Standing balance-Leahy Scale: Fair Standing balance comment: Able to static stand with no UE support and min guard                            Cognition Arousal/Alertness: Awake/alert Behavior During Therapy: WFL for tasks assessed/performed Overall Cognitive Status: Within Functional Limits for tasks assessed                                        Exercises Total Joint Exercises Short Arc Quad: AROM;Left;10 reps;Supine Heel Slides: AROM;Left;10 reps;Seated Hip ABduction/ADduction: AROM;Left;10 reps;Supine Straight Leg Raises: AROM;Left;10 reps;Supine Long Arc Quad: AROM;Left;10 reps;Seated Knee Flexion: AROM;Left;5 reps;Seated (10 sec holds)    General Comments        Pertinent Vitals/Pain Pain Assessment: Faces Faces Pain Scale: Hurts little more Pain Location: L Knee Pain Descriptors / Indicators: Aching;Guarding Pain Intervention(s): Monitored during session;Repositioned  Home Living                      Prior Function            PT Goals (current goals can now be found in the care plan section) Acute Rehab PT Goals Patient Stated Goal: Return home PT Goal Formulation: With  patient Time For Goal Achievement: 01/03/17 Potential to Achieve Goals: Good Progress towards PT goals: Progressing toward goals    Frequency    7X/week      PT Plan Current plan remains appropriate    Co-evaluation              AM-PAC PT "6 Clicks" Daily Activity  Outcome Measure  Difficulty turning over in bed (including adjusting bedclothes, sheets and blankets)?: A Little Difficulty moving from lying on back to sitting on the side of the bed? : A Little Difficulty sitting down on and standing up from a chair with arms (e.g., wheelchair, bedside commode, etc,.)?: A Little Help needed moving to and from a bed to chair (including a wheelchair)?: A Little Help needed walking in hospital room?: A Little Help needed climbing 3-5 steps with a railing? : A Little 6 Click Score: 18    End of Session Equipment Utilized During Treatment: Gait belt Activity Tolerance: Patient tolerated treatment well Patient left: in chair;with call bell/phone within reach Nurse Communication: Mobility status PT Visit Diagnosis: Other abnormalities of gait and mobility (R26.89)     Time: 1610-9604 PT Time Calculation (min) (ACUTE ONLY): 27 min  Charges:  $Gait Training: 8-22 mins $Therapeutic Exercise: 8-22 mins                    G Codes:       Kallie Locks, [place] Pager [phone] Acute Rehab   Sheral Apley 12/21/2016, 9:21 AM

## 2016-12-21 NOTE — Evaluation (Signed)
Occupational Therapy Evaluation Patient Details Name: Oscar Melendez MRN: [id number] DOB: November 28, 1950 Today's Date: 12/21/2016    History of Present Illness Pt is a 66 y.o. male s/p L TKA on 12/20/16. PMH includes R TKA (~3 mo ago), CAD, a-fib, HTN, CVA, obesity.   Clinical Impression   Pt admitted with the above diagnoses and presents with below problem list. PTA pt was independent with ADLs. Pt is currently setup to min guard with ADLs. ADL education reviewed with pt. No further OT needs indicated. Pt is for d/c home today. OT signing off.     Follow Up Recommendations  DC plan and follow up therapy as arranged by surgeon    Equipment Recommendations  None recommended by OT    Recommendations for Other Services       Precautions / Restrictions Precautions Precautions: Knee Precaution Booklet Issued: No Precaution Comments: Verball reviewed precautions Required Braces or Orthoses: Knee Immobilizer - Left (discontinue) Knee Immobilizer - Left: On when out of bed or walking;Other (comment) (order to d/c knee immobilizer on 10/6) Restrictions Weight Bearing Restrictions: Yes LLE Weight Bearing: Weight bearing as tolerated      Mobility Bed Mobility Overal bed mobility: Modified Independent Bed Mobility: Sit to Supine     Supine to sit: HOB elevated;Modified independent (Device/Increase time)     General bed mobility comments: no cueing or physical assist needed. Increased time required.  Transfers Overall transfer level: Needs assistance Equipment used: Rolling walker (2 wheeled) Transfers: Sit to/from Stand Sit to Stand: Min guard         General transfer comment: Pt required two attempts and cues for technique to rise into standing from EOB.    Balance Overall balance assessment: Needs assistance Sitting-balance support: No upper extremity supported;Feet supported;Feet unsupported Sitting balance-Leahy Scale: Good     Standing balance support: Bilateral  upper extremity supported;Single extremity supported;During functional activity Standing balance-Leahy Scale: Fair Standing balance comment: Able to static stand with no UE support and min guard                           ADL either performed or assessed with clinical judgement   ADL Overall ADL's : Needs assistance/impaired Eating/Feeding: Set up;Sitting   Grooming: Set up;Min guard;Sitting;Standing   Upper Body Bathing: Set up;Sitting   Lower Body Bathing: Min guard;Sit to/from stand   Upper Body Dressing : Set up;Sitting   Lower Body Dressing: Min guard;Sit to/from stand   Toilet Transfer: Min guard;Ambulation;Comfort height toilet;Grab bars;RW   Toileting- Architect and Hygiene: Min guard;Sit to/from stand   Tub/ Shower Transfer: Walk-in shower;Min guard;Ambulation;3 in 1;Rolling walker   Functional mobility during ADLs: Min guard;Rolling walker General ADL Comments: Pt completed toilet transfer, in-room functional mobility, and bed mobility. Reviewed ADL education.     Vision         Perception     Praxis      Pertinent Vitals/Pain Pain Assessment: Faces Faces Pain Scale: Hurts little more Pain Location: L Knee Pain Descriptors / Indicators: Aching;Guarding Pain Intervention(s): Limited activity within patient's tolerance;Monitored during session;Repositioned     Hand Dominance Right   Extremity/Trunk Assessment Upper Extremity Assessment Upper Extremity Assessment: Overall WFL for tasks assessed   Lower Extremity Assessment Lower Extremity Assessment: Defer to PT evaluation       Communication Communication Communication: No difficulties   Cognition Arousal/Alertness: Awake/alert Behavior During Therapy: WFL for tasks assessed/performed Overall Cognitive Status: Within Functional Limits for  tasks assessed                                     General Comments       Exercises Total Joint Exercises Short Arc  Quad: AROM;Left;10 reps;Supine Heel Slides: AROM;Left;10 reps;Seated Hip ABduction/ADduction: AROM;Left;10 reps;Supine Straight Leg Raises: AROM;Left;10 reps;Supine Long Arc Quad: AROM;Left;10 reps;Seated Knee Flexion: AROM;Left;5 reps;Seated (10 sec holds)   Shoulder Instructions      Home Living Family/patient expects to be discharged to:: Private residence Living Arrangements: Spouse/significant other;Children Available Help at Discharge: Family;Available 24 hours/day Type of Home: House Home Access: Ramped entrance     Home Layout: One level     Bathroom Shower/Tub: Producer, television/film/video: Handicapped height     Home Equipment: Environmental consultant - 2 wheels;Bedside commode          Prior Functioning/Environment Level of Independence: Independent        Comments: Exercises regularly        OT Problem List:        OT Treatment/Interventions:      OT Goals(Current goals can be found in the care plan section) Acute Rehab OT Goals Patient Stated Goal: Return home  OT Frequency:     Barriers to D/C:            Co-evaluation              AM-PAC PT "6 Clicks" Daily Activity     Outcome Measure Help from another person eating meals?: None Help from another person taking care of personal grooming?: None Help from another person toileting, which includes using toliet, bedpan, or urinal?: None Help from another person bathing (including washing, rinsing, drying)?: A Little Help from another person to put on and taking off regular upper body clothing?: None Help from another person to put on and taking off regular lower body clothing?: A Little 6 Click Score: 22   End of Session Equipment Utilized During Treatment: Rolling walker CPM Left Knee CPM Left Knee: Off Additional Comments: pt in cpm on arrival  Activity Tolerance: Patient tolerated treatment well Patient left: in bed;with call bell/phone within reach  OT Visit Diagnosis: Pain;Unsteadiness  on feet (R26.81) Pain - Right/Left: Left Pain - part of body: Knee                Time: 0950-1005 OT Time Calculation (min): 15 min Charges:  OT General Charges $OT Visit: 1 Visit OT Evaluation $OT Eval Low Complexity: 1 Low G-Codes: OT G-codes **NOT FOR INPATIENT CLASS** Functional Assessment Tool Used: AM-PAC 6 Clicks Daily Activity Functional Limitation: Self care Self Care Current Status (H0865): At least 20 percent but less than 40 percent impaired, limited or restricted Self Care Discharge Status [phone]): At least 20 percent but less than 40 percent impaired, limited or restricted     Pilar Grammes 12/21/2016, 10:14 AM

## 2016-12-21 NOTE — Plan of Care (Signed)
Problem: Education: Goal: Knowledge of [facility] Education information/materials will improve Outcome: Progressing POC reviewed with pt. [phone].

## 2016-12-21 NOTE — Discharge Instructions (Signed)
INSTRUCTIONS AFTER JOINT REPLACEMENT  ° °o Remove items at home which could result in a fall. This includes throw rugs or furniture in walking pathways °o ICE to the affected joint every three hours while awake for 30 minutes at a time, for at least the first 3-5 days, and then as needed for pain and swelling.  Continue to use ice for pain and swelling. You may notice swelling that will progress down to the foot and ankle.  This is normal after surgery.  Elevate your leg when you are not up walking on it.   °o Continue to use the breathing machine you got in the hospital (incentive spirometer) which will help keep your temperature down.  It is common for your temperature to cycle up and down following surgery, especially at night when you are not up moving around and exerting yourself.  The breathing machine keeps your lungs expanded and your temperature down. ° ° °DIET:  As you were doing prior to hospitalization, we recommend a well-balanced diet. ° °DRESSING / WOUND CARE / SHOWERING ° °You may change your dressing 3-5 days after surgery.  Then change the dressing every day with sterile gauze.  Please use good hand washing techniques before changing the dressing.  Do not use any lotions or creams on the incision until instructed by your surgeon. ° °ACTIVITY ° °o Increase activity slowly as tolerated, but follow the weight bearing instructions below.   °o No driving for 6 weeks or until further direction given by your physician.  You cannot drive while taking narcotics.  °o No lifting or carrying greater than 10 lbs. until further directed by your surgeon. °o Avoid periods of inactivity such as sitting longer than an hour when not asleep. This helps prevent blood clots.  °o You may return to work once you are authorized by your doctor.  ° ° ° °WEIGHT BEARING  ° °Weight bearing as tolerated with assist device (walker, cane, etc) as directed, use it as long as suggested by your surgeon or therapist, typically at  least 4-6 weeks. ° ° °EXERCISES ° °Results after joint replacement surgery are often greatly improved when you follow the exercise, range of motion and muscle strengthening exercises prescribed by your doctor. Safety measures are also important to protect the joint from further injury. Any time any of these exercises cause you to have increased pain or swelling, decrease what you are doing until you are comfortable again and then slowly increase them. If you have problems or questions, call your caregiver or physical therapist for advice.  ° °Rehabilitation is important following a joint replacement. After just a few days of immobilization, the muscles of the leg can become weakened and shrink (atrophy).  These exercises are designed to build up the tone and strength of the thigh and leg muscles and to improve motion. Often times heat used for twenty to thirty minutes before working out will loosen up your tissues and help with improving the range of motion but do not use heat for the first two weeks following surgery (sometimes heat can increase post-operative swelling).  ° °These exercises can be done on a training (exercise) mat, on the floor, on a table or on a bed. Use whatever works the best and is most comfortable for you.    Use music or television while you are exercising so that the exercises are a pleasant break in your day. This will make your life better with the exercises acting as a break   in your routine that you can look forward to.   Perform all exercises about fifteen times, three times per day or as directed.  You should exercise both the operative leg and the other leg as well. ° °Exercises include: °  °• Quad Sets - Tighten up the muscle on the front of the thigh (Quad) and hold for 5-10 seconds.   °• Straight Leg Raises - With your knee straight (if you were given a brace, keep it on), lift the leg to 60 degrees, hold for 3 seconds, and slowly lower the leg.  Perform this exercise against  resistance later as your leg gets stronger.  °• Leg Slides: Lying on your back, slowly slide your foot toward your buttocks, bending your knee up off the floor (only go as far as is comfortable). Then slowly slide your foot back down until your leg is flat on the floor again.  °• Angel Wings: Lying on your back spread your legs to the side as far apart as you can without causing discomfort.  °• Hamstring Strength:  Lying on your back, push your heel against the floor with your leg straight by tightening up the muscles of your buttocks.  Repeat, but this time bend your knee to a comfortable angle, and push your heel against the floor.  You may put a pillow under the heel to make it more comfortable if necessary.  ° °A rehabilitation program following joint replacement surgery can speed recovery and prevent re-injury in the future due to weakened muscles. Contact your doctor or a physical therapist for more information on knee rehabilitation.  ° ° °CONSTIPATION ° °Constipation is defined medically as fewer than three stools per week and severe constipation as less than one stool per week.  Even if you have a regular bowel pattern at home, your normal regimen is likely to be disrupted due to multiple reasons following surgery.  Combination of anesthesia, postoperative narcotics, change in appetite and fluid intake all can affect your bowels.  ° °YOU MUST use at least one of the following options; they are listed in order of increasing strength to get the job done.  They are all available over the counter, and you may need to use some, POSSIBLY even all of these options:   ° °Drink plenty of fluids (prune juice may be helpful) and high fiber foods °Colace 100 mg by mouth twice a day  °Senokot for constipation as directed and as needed Dulcolax (bisacodyl), take with full glass of water  °Miralax (polyethylene glycol) once or twice a day as needed. ° °If you have tried all these things and are unable to have a bowel  movement in the first 3-4 days after surgery call either your surgeon or your primary doctor.   ° °If you experience loose stools or diarrhea, hold the medications until you stool forms back up.  If your symptoms do not get better within 1 week or if they get worse, check with your doctor.  If you experience "the worst abdominal pain ever" or develop nausea or vomiting, please contact the office immediately for further recommendations for treatment. ° ° °ITCHING:  If you experience itching with your medications, try taking only a single pain pill, or even half a pain pill at a time.  You can also use Benadryl over the counter for itching or also to help with sleep.  ° °TED HOSE STOCKINGS:  Use stockings on both legs until for at least 2 weeks or as   directed by physician office. They may be removed at night for sleeping. ° °MEDICATIONS:  See your medication summary on the “After Visit Summary” that nursing will review with you.  You may have some home medications which will be placed on hold until you complete the course of blood thinner medication.  It is important for you to complete the blood thinner medication as prescribed. ° °PRECAUTIONS:  If you experience chest pain or shortness of breath - call 911 immediately for transfer to the hospital emergency department.  ° °If you develop a fever greater that 101 F, purulent drainage from wound, increased redness or drainage from wound, foul odor from the wound/dressing, or calf pain - CONTACT YOUR SURGEON.   °                                                °FOLLOW-UP APPOINTMENTS:  If you do not already have a post-op appointment, please call the office for an appointment to be seen by your surgeon.  Guidelines for how soon to be seen are listed in your “After Visit Summary”, but are typically between 1-4 weeks after surgery. ° °OTHER INSTRUCTIONS:  ° °Knee Replacement:  Do not place pillow under knee, focus on keeping the knee straight while resting. CPM  instructions: 0-90 degrees, 2 hours in the morning, 2 hours in the afternoon, and 2 hours in the evening. Place foam block, curve side up under heel at all times except when in CPM or when walking.  DO NOT modify, tear, cut, or change the foam block in any way. ° °MAKE SURE YOU:  °• Understand these instructions.  °• Get help right away if you are not doing well or get worse.  ° ° °Thank you for letting us be a part of your medical care team.  It is a privilege we respect greatly.  We hope these instructions will help you stay on track for a fast and full recovery!  ° ° ° °Information on my medicine - ELIQUIS® (apixaban) ° °This medication education was reviewed with me or my healthcare representative as part of my discharge preparation.  The pharmacist that spoke with me during my hospital stay was:  Lucille Witts Dien, RPH ° °Why was Eliquis® prescribed for you? °Eliquis® was prescribed for you to reduce the risk of blood clots forming after orthopedic surgery.   ° °What do You need to know about Eliquis®? °Take your Eliquis® TWICE DAILY - one tablet in the morning and one tablet in the evening with or without food.  It would be best to take the dose about the same time each day. ° °If you have difficulty swallowing the tablet whole please discuss with your pharmacist how to take the medication safely. ° °Take Eliquis® exactly as prescribed by your doctor and DO NOT stop taking Eliquis® without talking to the doctor who prescribed the medication.  Stopping without other medication to take the place of Eliquis® may increase your risk of developing a clot. ° °After discharge, you should have regular check-up appointments with your healthcare provider that is prescribing your Eliquis®. ° °What do you do if you miss a dose? °If a dose of ELIQUIS® is not taken at the scheduled time, take it as soon as possible on the same day and twice-daily administration should be resumed.  The dose should not   be doubled to make up for  a missed dose.  Do not take more than one tablet of ELIQUIS at the same time. ° °Important Safety Information °A possible side effect of Eliquis® is bleeding. You should call your healthcare provider right away if you experience any of the following: °? Bleeding from an injury or your nose that does not stop. °? Unusual colored urine (red or dark brown) or unusual colored stools (red or black). °? Unusual bruising for unknown reasons. °? A serious fall or if you hit your head (even if there is no bleeding). ° °Some medicines may interact with Eliquis® and might increase your risk of bleeding or clotting while on Eliquis®. To help avoid this, consult your healthcare provider or pharmacist prior to using any new prescription or non-prescription medications, including herbals, vitamins, non-steroidal anti-inflammatory drugs (NSAIDs) and supplements. ° °This website has more information on Eliquis® (apixaban): http://www.eliquis.com/eliquis/home ° °

## 2016-12-21 NOTE — Care Management CC44 (Signed)
Condition Code 44 Documentation Completed  Patient Details  Name: Oscar Melendez MRN: [id number] Date of Birth: 1950/10/13   Condition Code 44 given:  Yes Patient signature on Condition Code 44 notice:  Yes Documentation of 2 MD's agreement:  Yes Code 44 added to claim:  Yes    CrutchfieldDerrill Memo, RN 12/21/2016, 9:38 AM

## 2016-12-22 ENCOUNTER — Encounter (HOSPITAL_COMMUNITY): Payer: Self-pay | Admitting: Orthopedic Surgery

## 2016-12-23 NOTE — Progress Notes (Signed)
Error for missed G-codes.    12/20/16 1642  PT G-Codes **NOT FOR INPATIENT CLASS**  Functional Assessment Tool Used AM-PAC 6 Clicks Basic Mobility  Functional Limitation Mobility: Walking and moving around  Mobility: Walking and Moving Around Current Status [phone]) CK  Mobility: Walking and Moving Around Goal Status (U0454) CI   Ina Homes, PT, DPT Acute Rehab Services  Pager: [phone]

## 2018-01-27 IMAGING — CR DG CHEST 2V
2 series · 2 of 2 positions shown · non-contrast
Comparison: Radiograph February 21, 2015.

CLINICAL DATA: Preop for right knee replacement.

EXAM:
CHEST  2 VIEW

[w chest pa]
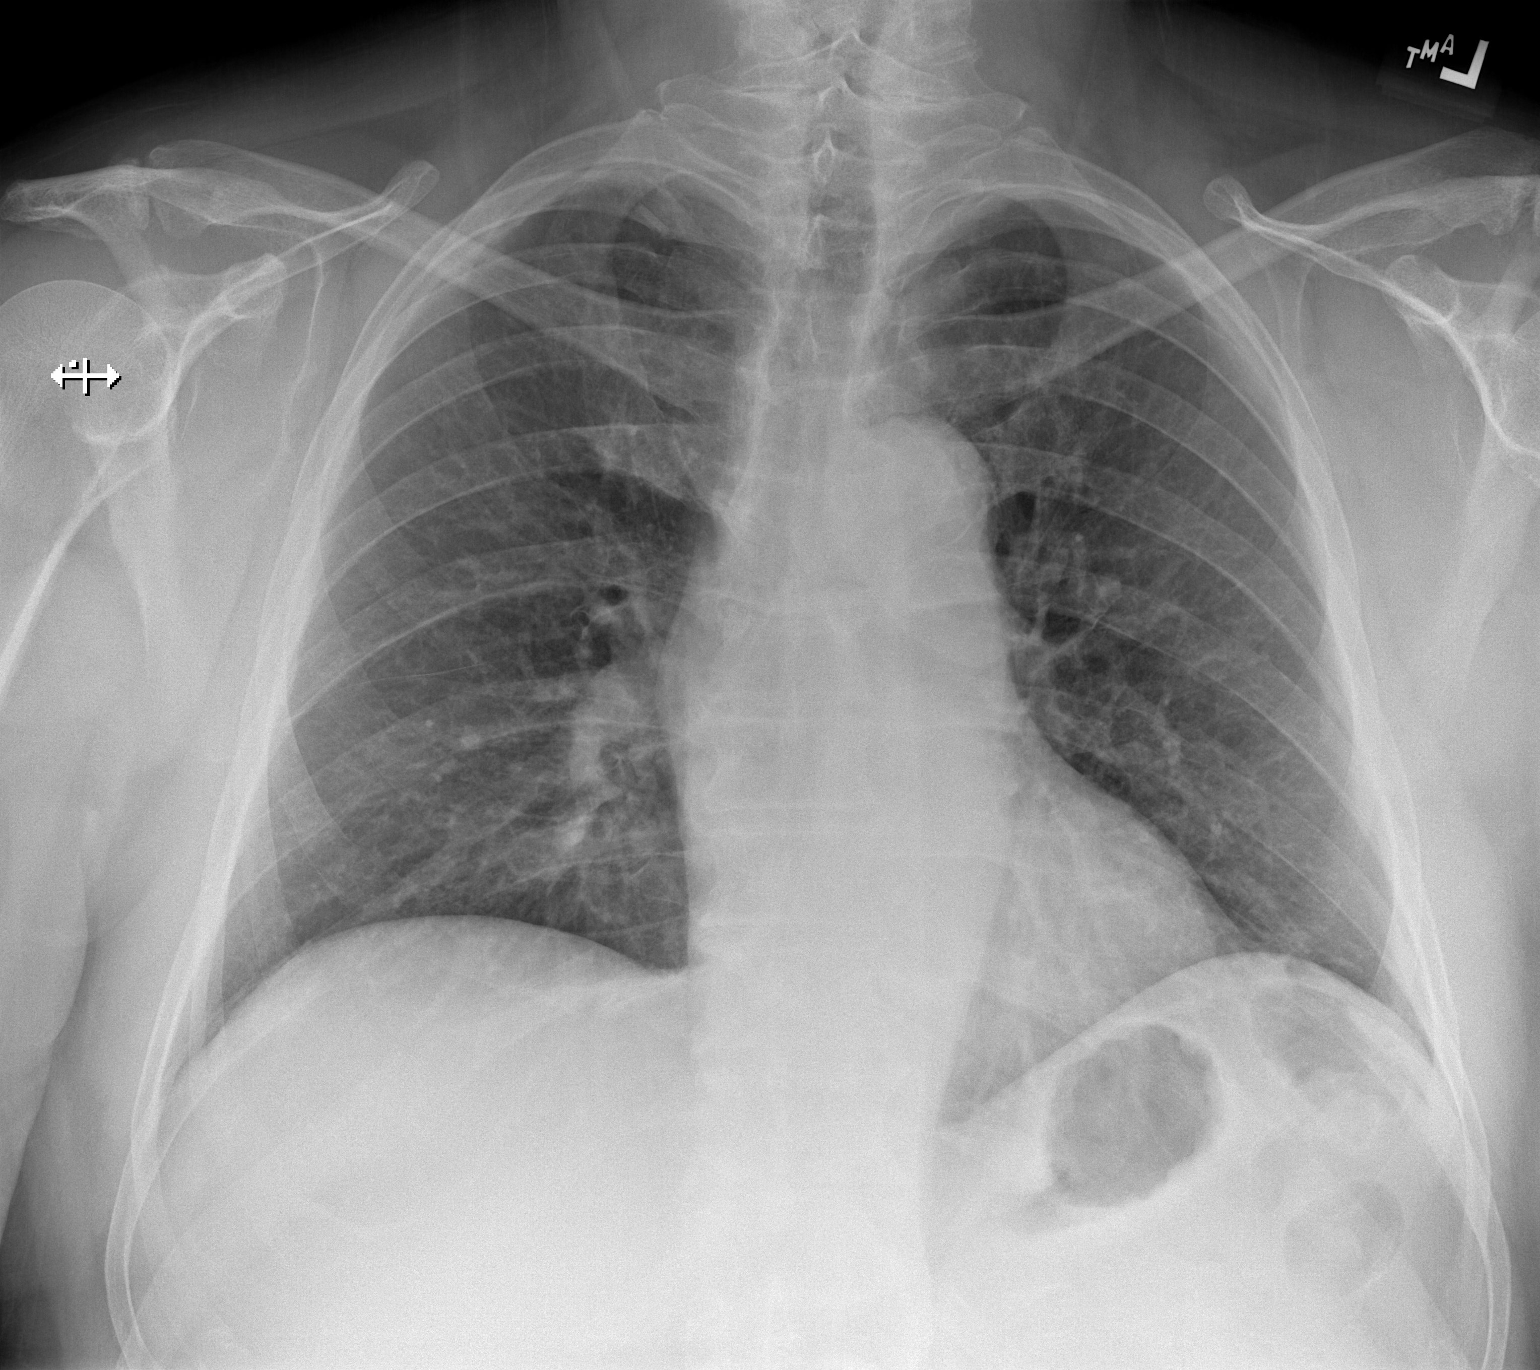

[w chest lat]
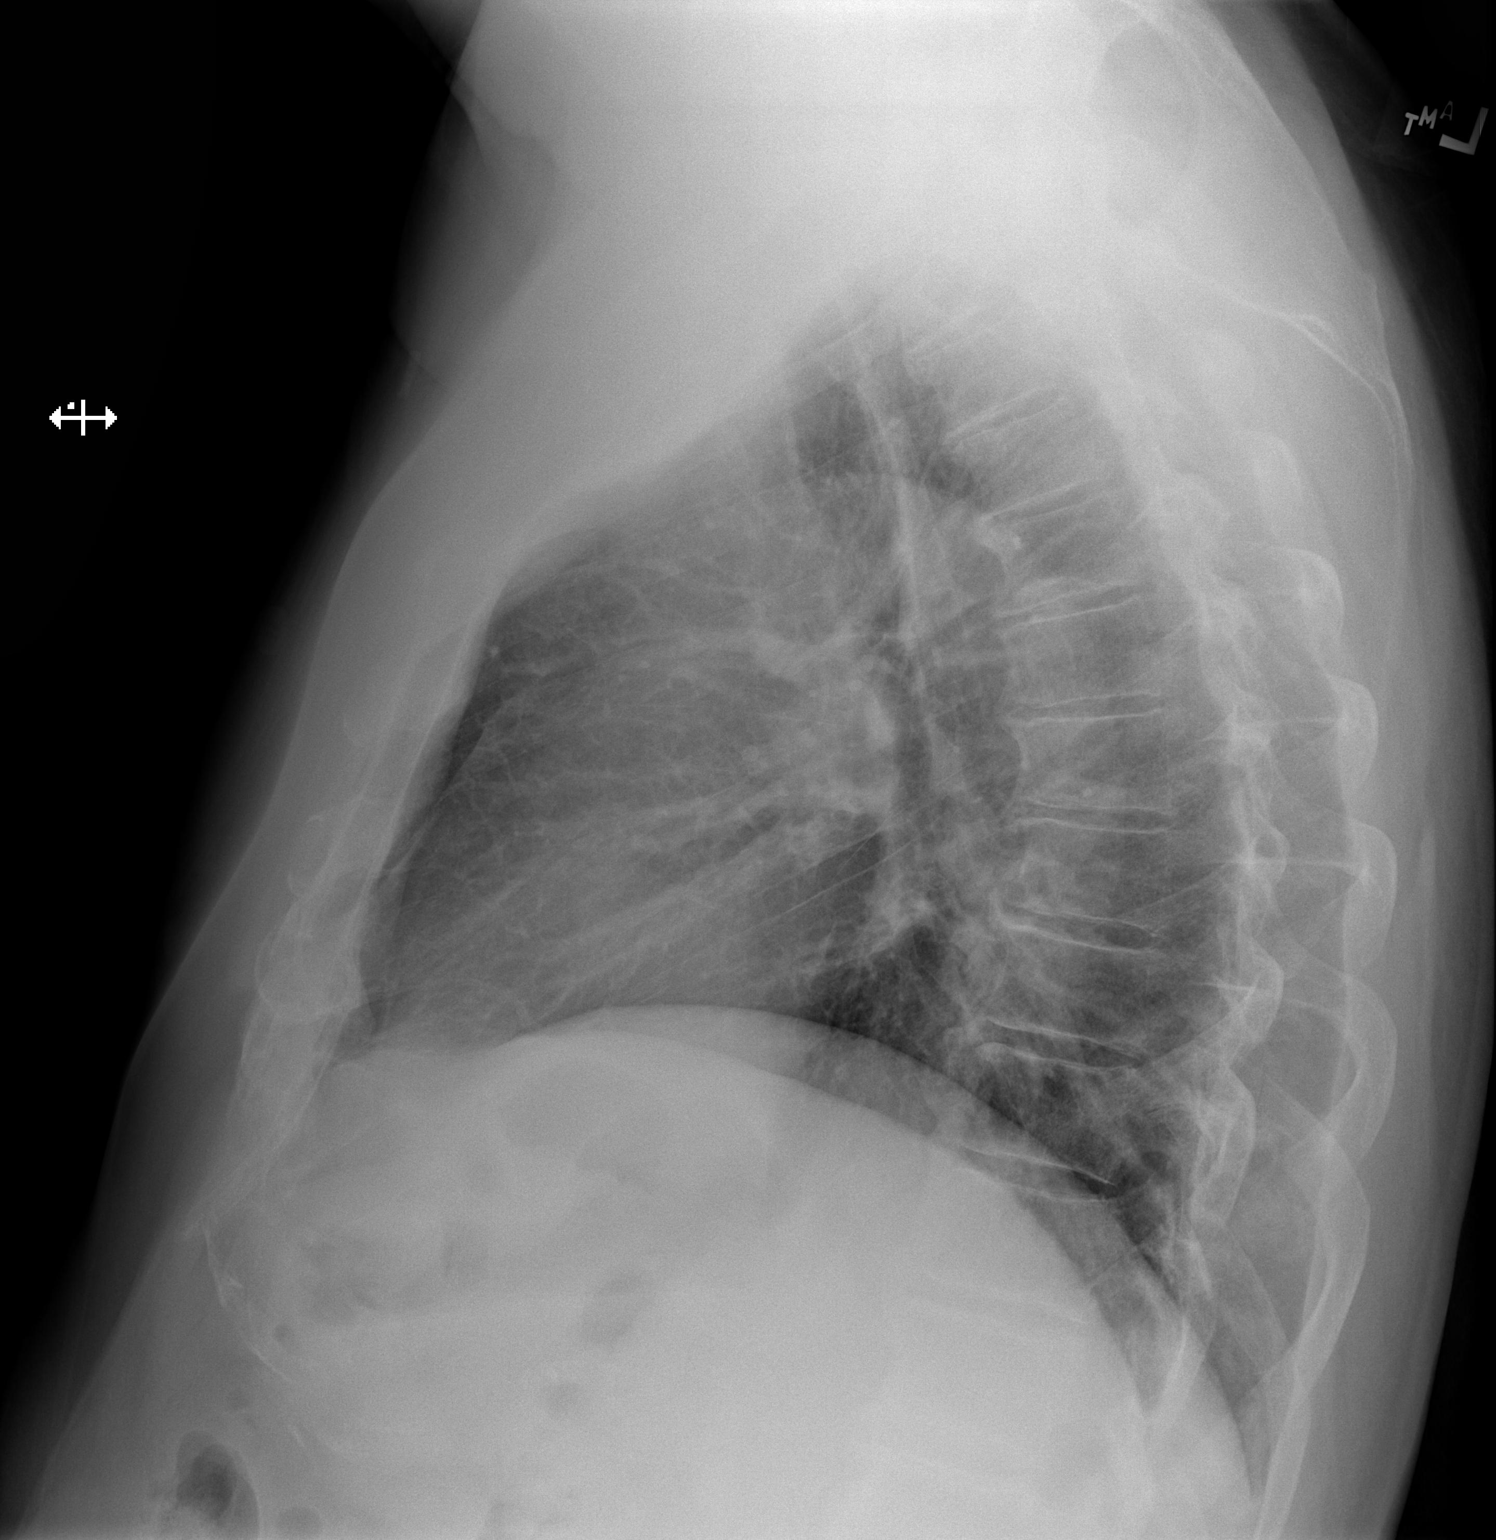

[2 of 2 positions shown; findings below may reference images not displayed]

FINDINGS: The heart size and mediastinal contours are within normal limits.
Both lungs are clear. Atherosclerosis of thoracic aorta is noted.
The visualized skeletal structures are unremarkable.
IMPRESSION: No active cardiopulmonary disease.  Aortic atherosclerosis.

## 2018-05-07 NOTE — Progress Notes (Deleted)
Cardiology Office Note   Date:  05/07/2018   ID:  Oscar Melendez, Oscar Melendez 02/10/1951, MRN [id number]  PCP:  Gordan Payment., MD    No chief complaint on file.    Wt Readings from Last 3 Encounters:  12/09/16 260 lb 4.8 oz (118.1 kg)  08/19/16 266 lb 12.8 oz (121 kg)  07/09/16 274 lb (124.3 kg)       History of Present Illness: Oscar Melendez is a 68 y.o. male  With a h/o CAD.  He had a cardiac cath in 2009.  He had a fast heart rate and was diagnosed with AFib.  He felt a tightness which led to a cath showing an RCA CTO.  He has been managed medically.     Past Medical History:  Diagnosis Date  . Atherosclerotic heart disease of native coronary artery without angina pectoris   . Atrial fibrillation (HCC)   . CAD (coronary artery disease) 07/09/2016  . Chronic knee pain   . Dermatophytosis of nail   . H/O inguinal hernia repair   . History of kidney stones    has passed 3 stones  . Hyperlipemia   . Hypertension   . Impotence of organic origin   . Incomplete RBBB   . Morbid obesity (HCC)   . Onychomycosis   . Osteoarthrosis    knees  . PONV (postoperative nausea and vomiting)    nausea only  . RCA occlusion (HCC)   . 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 Transient cerebral ischemia     Past Surgical History:  Procedure Laterality Date  . HERNIA REPAIR    . INNER EAR SURGERY Left prior to 1990  . JOINT REPLACEMENT    . 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 TOTAL KNEE ARTHROPLASTY Right 08/30/2016   Procedure: RIGHT TOTAL KNEE ARTHROPLASTY;  Surgeon: Frederico Hamman, MD;  Location: [facility] OR;  Service: Orthopedics;  Laterality: Right;  . TOTAL KNEE ARTHROPLASTY Left 12/20/2016   Procedure: LEFT TOTAL KNEE ARTHROPLASTY;  Surgeon: Frederico Hamman, MD;  Location: [facility] OR;  Service: Orthopedics;  Laterality: Left;     Current Outpatient Medications  Medication Sig Dispense Refill  . acetaminophen (TYLENOL) 500 MG tablet Take 500 mg by  mouth every 6 (six) hours as needed.    Marland Kitchen apixaban (ELIQUIS) 2.5 MG TABS tablet Take 1 tablet (2.5 mg total) by mouth 2 (two) times daily. 24 tablet 0  . aspirin EC 81 MG tablet Take 81 mg by mouth daily.    Marland Kitchen b complex vitamins capsule Take 1 capsule by mouth daily.    . Calcium Citrate (CAL-CITRATE PO) Take 1 tablet by mouth daily.    Marland Kitchen FIBER PO Take 1 tablet by mouth daily.    Marland Kitchen HYDROcodone-acetaminophen (NORCO) 5-325 MG tablet Take 1-2 tablets by mouth every 4 (four) hours as needed for moderate pain. 60 tablet 0  . lisinopril-hydrochlorothiazide (PRINZIDE,ZESTORETIC) 20-25 MG tablet Take 1 tablet by mouth daily.    . Magnesium 100 MG TABS Take 1 tablet by mouth daily.    . metoprolol (LOPRESSOR) 50 MG tablet Take 25 mg by mouth every morning. Per patient he takes 12.5mg  tablet now.    . Potassium 99 MG TABS Take 1 tablet by mouth daily.    . traMADol (ULTRAM) 50 MG tablet 1-2 tabs po q6hrs prn pain 60 tablet 0  . Vitamin D, Cholecalciferol, 1000 units CAPS Take 1 capsule by mouth daily.  No current facility-administered medications for this visit.     Allergies:   Dye fdc red [red dye] and Doxycycline    Social History:  The patient  reports that he has never smoked. He has never used smokeless tobacco. He reports that he does not drink alcohol or use drugs.   Family History:  The patient's ***family history includes Cancer in his brother; Heart disease in his mother.    ROS:  Please see the history of present illness.   Otherwise, review of systems are positive for ***.   All other systems are reviewed and negative.    PHYSICAL EXAM: VS:  There were no vitals taken for this visit. , BMI There is no height or weight on file to calculate BMI. GEN: Well nourished, well developed, in no acute distress  HEENT: normal  Neck: no JVD, carotid bruits, or masses Cardiac: ***RRR; no murmurs, rubs, or gallops,no edema  Respiratory:  clear to auscultation bilaterally, normal work of  breathing GI: soft, nontender, nondistended, + BS MS: no deformity or atrophy  Skin: warm and dry, no rash Neuro:  Strength and sensation are intact Psych: euthymic mood, full affect   EKG:   The ekg ordered today demonstrates ***   Recent Labs: No results found for requested labs within last 8760 hours.   Lipid Panel No results found for: CHOL, TRIG, HDL, CHOLHDL, VLDL, LDLCALC, LDLDIRECT   Other studies Reviewed: Additional studies/ records that were reviewed today with results demonstrating: ***.   ASSESSMENT AND PLAN:  1. CAD: Known RCA CTO since 2009. 2. HTN: 3. He should be on a statin.  He was hesitant to start this when I saw him in 2018.   Current medicines are reviewed at length with the patient today.  The patient concerns regarding his medicines were addressed.  The following changes have been made:  No change***  Labs/ tests ordered today include: *** No orders of the defined types were placed in this encounter.   Recommend 150 minutes/week of aerobic exercise Low fat, low carb, high fiber diet recommended  Disposition:   FU in ***   Signed, Lance Muss, MD  05/07/2018 12:56 PM    [facility] [address] [place], [place], [place]  [address] Phone: [phone]; Fax: [phone]

## 2018-05-08 ENCOUNTER — Ambulatory Visit: Payer: Medicare HMO | Admitting: Interventional Cardiology
